# Patient Record
Sex: Male | Born: 1995 | Race: White | Hispanic: Yes | Smoking: Former smoker
Health system: Southern US, Community
[De-identification: ages and names within clinical notes are randomized; demographics above are authoritative.]

## PROBLEM LIST (undated history)

## (undated) DIAGNOSIS — E0590 Thyrotoxicosis, unspecified without thyrotoxic crisis or storm: Secondary | ICD-10-CM

## (undated) DIAGNOSIS — Z789 Other specified health status: Secondary | ICD-10-CM

## (undated) HISTORY — DX: Other specified health status: Z78.9

---
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
## 2019-06-07 ENCOUNTER — Emergency Department (HOSPITAL_COMMUNITY)
Admission: EM | Admit: 2019-06-07 | Discharge: 2019-06-08 | Disposition: A | Discharge status: Home / Self Care | Payer: Self-pay | Attending: Emergency Medicine | Admitting: Emergency Medicine

## 2019-06-07 ENCOUNTER — Emergency Department (HOSPITAL_COMMUNITY): Payer: Self-pay

## 2019-06-07 ENCOUNTER — Encounter (HOSPITAL_COMMUNITY): Payer: Self-pay

## 2019-06-07 ENCOUNTER — Other Ambulatory Visit: Payer: Self-pay

## 2019-06-07 DIAGNOSIS — R0602 Shortness of breath: Secondary | ICD-10-CM | POA: Insufficient documentation

## 2019-06-07 DIAGNOSIS — Z20828 Contact with and (suspected) exposure to other viral communicable diseases: Secondary | ICD-10-CM | POA: Insufficient documentation

## 2019-06-07 DIAGNOSIS — J069 Acute upper respiratory infection, unspecified: Secondary | ICD-10-CM | POA: Insufficient documentation

## 2019-06-07 MED ORDER — ACETAMINOPHEN 500 MG PO TABS
1000.0000 mg | ORAL_TABLET | Freq: Once | ORAL | Status: AC
  Administered 2019-06-07: 1000 mg via ORAL
  Filled 2019-06-07: qty 2

## 2019-06-07 NOTE — ED Triage Notes (Signed)
Pt c/o dry cough and sore throat x 4 days. Pt states he did have a temp of 99.0 earlier, however it has gone down since then. Pt denies any COVID contact. Pt denies headache or CP. Sates he has occasional SHOB.

## 2019-06-07 NOTE — ED Notes (Signed)
Provided pt with water for fluid encouragement.

## 2019-06-08 NOTE — ED Notes (Signed)
Pt was verbalized discharge instructions. Pt had no further questions at this time. NAD. 

## 2019-06-08 NOTE — ED Notes (Signed)
Pt able to tolerate small sips of water. Will continue to monitor.

## 2019-06-08 NOTE — Discharge Instructions (Addendum)
You were evaluated in the Emergency Department and after careful evaluation, we did not find any emergent condition requiring admission or further testing in the hospital.  Your exam/testing today is overall reassuring.  Your symptoms seem to be due to a viral illness, possibly the coronavirus.  We have tested you for the coronavirus here in the Emergency Department.  Please isolate or quarantine at home until you receive a negative test result.  If positive, we recommend continued home quarantine per CDC recommendations.   Please return to the Emergency Department if you experience any worsening of your condition.  We encourage you to follow up with a primary care provider.  Thank you for allowing us to be a part of your care. 

## 2019-06-08 NOTE — ED Provider Notes (Signed)
[facility]-EMERGENCY DEPT [facility] Emergency Department Provider Note MRN:  [id number]  Arrival date & time: 06/08/19     Chief Complaint   Cough and Sore Throat   History of Present Illness   Joseph Mclaughlin is a 23 y.o. year-old male with no pertinent past medical history presenting to the ED with chief complaint of cough and sore throat.  4 days of dry cough, mild sore throat.  Denies headache or vision change.  Endorsing mild shortness of breath today.  Denies chest pain, no abdominal pain, no vomiting, no diarrhea.  Review of Systems  A complete 10 system review of systems was obtained and all systems are negative except as noted in the HPI and PMH.   Patient's Health History    Past Medical History:  Diagnosis Date  . Medical history non-contributory     History reviewed. No pertinent surgical history.  No family history on file.  Social History   Socioeconomic History  . Marital status: Single    Spouse name: Not on file  . Number of children: Not on file  . Years of education: Not on file  . Highest education level: Not on file  Occupational History  . Not on file  Social Needs  . Financial resource strain: Not on file  . Food insecurity    Worry: Not on file    Inability: Not on file  . Transportation needs    Medical: Not on file    Non-medical: Not on file  Tobacco Use  . Smoking status: Never Smoker  . Smokeless tobacco: Never Used  Substance and Sexual Activity  . Alcohol use: No  . Drug use: No  . Sexual activity: Not on file  Lifestyle  . Physical activity    Days per week: Not on file    Minutes per session: Not on file  . Stress: Not on file  Relationships  . Social Musician on phone: Not on file    Gets together: Not on file    Attends religious service: Not on file    Active member of club or organization: Not on file    Attends meetings of clubs or organizations: Not on file    Relationship status: Not on file  .  Intimate partner violence    Fear of current or ex partner: Not on file    Emotionally abused: Not on file    Physically abused: Not on file    Forced sexual activity: Not on file  Other Topics Concern  . Not on file  Social History Narrative  . Not on file     Physical Exam  Vital Signs and Nursing Notes reviewed Vitals:   06/07/19 2303 06/08/19 0000  BP: (!) 161/90 (!) 127/91  Pulse: (!) 120 (!) 116  Resp: 20 20  Temp: 98.2 F (36.8 C)   SpO2: 96% 96%    CONSTITUTIONAL: Well-appearing, NAD NEURO:  Alert and oriented x 3, no focal deficits EYES:  eyes equal and reactive ENT/NECK:  no LAD, no JVD CARDIO: Tachycardic rate, well-perfused, normal S1 and S2 PULM:  CTAB no wheezing or rhonchi GI/GU:  normal bowel sounds, non-distended, non-tender MSK/SPINE:  No gross deformities, no edema SKIN:  no rash, atraumatic PSYCH:  Appropriate speech and behavior  Diagnostic and Interventional Summary    EKG Interpretation  Date/Time:    Ventricular Rate:    PR Interval:    QRS Duration:   QT Interval:    QTC Calculation:  R Axis:     Text Interpretation:        Labs Reviewed  NOVEL CORONAVIRUS, NAA (HOSP ORDER, SEND-OUT TO REF LAB; TAT 18-24 HRS)    XR Chest Single View  Final Result      Medications  acetaminophen (TYLENOL) tablet 1,000 mg (1,000 mg Oral Given 06/07/19 2352)     Procedures  /  Critical Care Procedures  ED Course and Medical Decision Making  I have reviewed the triage vital signs and the nursing notes.  Pertinent labs & imaging results that were available during my care of the patient were reviewed by me and considered in my medical decision making (see below for details).    Consistent with viral illness, possibly flu or coronavirus.  Patient complaining of some mild shortness of breath but chest x-ray is normal, no increased work of breathing, clear lungs.  No evidence of DVT, doubt pulmonary embolism.  Patient's heart rate improved after  Tylenol and p.o. fluids.  Appropriate for discharge.  Barth Kirks. Sedonia Small, Major [email or web address]  Final Clinical Impressions(s) / ED Diagnoses     ICD-10-CM   1. Viral URI with cough  J06.9   2. SOB (shortness of breath)  R06.02 XR Chest Single View    XR Chest Single View    ED Discharge Orders    None       Discharge Instructions Discussed with and Provided to Patient:     Discharge Instructions     You were evaluated in the Emergency Department and after careful evaluation, we did not find any emergent condition requiring admission or further testing in the hospital.  Your exam/testing today is overall reassuring.  Your symptoms seem to be due to a viral illness, possibly the coronavirus.  We have tested you for the coronavirus here in the Emergency Department.  Please isolate or quarantine at home until you receive a negative test result.  If positive, we recommend continued home quarantine per [facility] recommendations.   Please return to the Emergency Department if you experience any worsening of your condition.  We encourage you to follow up with a primary care provider.  Thank you for allowing [place] to be a part of your care.      Maudie Flakes, MD 06/08/19 [phone]

## 2019-06-09 LAB — NOVEL CORONAVIRUS, NAA (HOSP ORDER, SEND-OUT TO REF LAB; TAT 18-24 HRS): SARS-CoV-2, NAA: NOT DETECTED

## 2020-02-15 ENCOUNTER — Encounter (HOSPITAL_COMMUNITY): Payer: Self-pay | Admitting: Emergency Medicine

## 2020-02-15 ENCOUNTER — Other Ambulatory Visit: Payer: Self-pay

## 2020-02-15 ENCOUNTER — Emergency Department (HOSPITAL_COMMUNITY): Payer: Self-pay

## 2020-02-15 DIAGNOSIS — R05 Cough: Secondary | ICD-10-CM | POA: Insufficient documentation

## 2020-02-15 DIAGNOSIS — Z20822 Contact with and (suspected) exposure to covid-19: Secondary | ICD-10-CM | POA: Insufficient documentation

## 2020-02-15 NOTE — ED Triage Notes (Signed)
Patient arrives with complaints of a cough and fatigue. Patient has no other complaints at this time.

## 2020-02-16 ENCOUNTER — Emergency Department (HOSPITAL_COMMUNITY)
Admission: EM | Admit: 2020-02-16 | Discharge: 2020-02-16 | Disposition: A | Discharge status: Home / Self Care | Payer: Self-pay | Attending: Emergency Medicine | Admitting: Emergency Medicine

## 2020-02-16 DIAGNOSIS — Z20822 Contact with and (suspected) exposure to covid-19: Secondary | ICD-10-CM

## 2020-02-16 DIAGNOSIS — R05 Cough: Secondary | ICD-10-CM

## 2020-02-16 DIAGNOSIS — R059 Cough, unspecified: Secondary | ICD-10-CM

## 2020-02-16 LAB — SARS CORONAVIRUS 2 BY RT PCR (HOSPITAL ORDER, PERFORMED IN ~~LOC~~ HOSPITAL LAB): SARS Coronavirus 2: NEGATIVE

## 2020-02-16 NOTE — Discharge Instructions (Signed)
You have a Covid test pending and I will call you if results are positive.  I suspect your symptoms are due to Covid or other viral infection.  Please continue to quarantine at home and monitor your symptoms closely. You chest x-ray was clear. Antibiotics are not helpful in treating viral infection, the virus should run its course in about 10-14 days. Please make sure you are drinking plenty of fluids. You can treat your symptoms supportively with tylenol for fevers and pains, and over the counter cough syrups and throat lozenges to help with cough. If your symptoms are not improving please follow up with you Primary doctor.   I recommend that you purchase a home pulse ox to help better monitor your oxygen at home, if you start to have increased work of breathing or shortness of breath or your oxygen drops below 92% please immediately return to the hospital for reevaluation.  If you develop persistent fevers, shortness of breath or difficulty breathing, chest pain, severe headache and neck pain, persistent nausea and vomiting or other new or concerning symptoms return to the Emergency department.

## 2020-02-16 NOTE — ED Notes (Signed)
ED Provider at bedside. 

## 2020-02-16 NOTE — ED Provider Notes (Signed)
[facility]-EMERGENCY DEPT Provider Note   CSN: [id number] Arrival date & time: 02/15/20  2057     History Chief Complaint  Patient presents with  . Cough    Joseph Mclaughlin is a 23 y.o. male.  Joseph Mclaughlin is a 24 y.o. male who is otherwise healthy, presents to the emergency department for evaluation of cough and fatigue.  Cough nasal congestion has been present for about a week but worsening last night while the patient was at work.  He works at [employer].  He states that he only has chest pain with coughing, no shortness of breath.  He has not had any fevers or chills, has had some body aches and headache.  No vomiting, diarrhea or abdominal pain.  Patient did have his Covid vaccine a few months ago and does not have any known direct exposures.  He has not taken any medicine to treat his symptoms prior to arrival.  No other aggravating or alleviating factors.        Past Medical History:  Diagnosis Date  . Medical history non-contributory     Patient Active Problem List   Diagnosis Date Noted  . Vasovagal near syncope 11/22/2014    History reviewed. No pertinent surgical history.     No family history on file.  Social History   Tobacco Use  . Smoking status: Never Smoker  . Smokeless tobacco: Never Used  Substance Use Topics  . Alcohol use: No  . Drug use: No    Home Medications Prior to Admission medications   Medication Sig Start Date End Date Taking? Authorizing Provider  loratadine (CLARITIN REDITABS) 10 MG dissolvable tablet Take 1 tablet (10 mg total) by mouth daily. Take one tablet as needed for allergy symptoms Patient not taking: Reported on 06/07/2019 05/05/15   Tilman Neat, MD    Allergies    Patient has no known allergies.  Review of Systems   Review of Systems  Constitutional: Positive for fatigue. Negative for chills and fever.  HENT: Positive for congestion and rhinorrhea.   Eyes: Negative for visual  disturbance.  Respiratory: Positive for cough. Negative for shortness of breath.   Cardiovascular: Negative for chest pain.  Gastrointestinal: Negative for abdominal pain, diarrhea, nausea and vomiting.  Genitourinary: Negative for dysuria and frequency.  Musculoskeletal: Negative for arthralgias and myalgias.  Skin: Negative for color change and rash.  Neurological: Positive for headaches. Negative for dizziness, syncope and light-headedness.  All other systems reviewed and are negative.   Physical Exam Updated Vital Signs BP 140/88 (BP Location: Left Arm)   Pulse (!) 115   Temp 98.2 F (36.8 C) (Oral)   Resp 16   Ht 5\' 5"  (1.651 m)   Wt 57.1 kg   SpO2 96%   BMI 20.95 kg/m   Physical Exam Vitals and nursing note reviewed.  Constitutional:      General: He is not in acute distress.    Appearance: Normal appearance. He is well-developed and normal weight. He is not ill-appearing or diaphoretic.     Comments: Well-appearing and in no distress  HENT:     Head: Normocephalic and atraumatic.  Eyes:     General:        Right eye: No discharge.        Left eye: No discharge.  Neck:     Comments: No rigidity Cardiovascular:     Rate and Rhythm: Normal rate and regular rhythm.     Heart sounds: Normal  heart sounds. No murmur heard.  No friction rub. No gallop.      Comments: Tachycardic on arrival, but on my evaluation normal heart rate in the 90s Pulmonary:     Effort: Pulmonary effort is normal. No respiratory distress.     Breath sounds: Normal breath sounds.     Comments: Respirations equal and unlabored, patient able to speak in full sentences, lungs clear to auscultation bilaterally Abdominal:     General: Bowel sounds are normal. There is no distension.     Palpations: Abdomen is soft. There is no mass.     Tenderness: There is no abdominal tenderness. There is no guarding.     Comments: Abdomen soft, nondistended, nontender to palpation in all quadrants without  guarding or peritoneal signs  Musculoskeletal:        General: No deformity.     Cervical back: Neck supple.  Lymphadenopathy:     Cervical: No cervical adenopathy.  Skin:    General: Skin is warm and dry.     Capillary Refill: Capillary refill takes less than 2 seconds.  Neurological:     Mental Status: He is alert and oriented to person, place, and time.  Psychiatric:        Mood and Affect: Mood normal.        Behavior: Behavior normal.     ED Results / Procedures / Treatments   Labs (all labs ordered are listed, but only abnormal results are displayed) Labs Reviewed  SARS CORONAVIRUS 2 BY RT PCR (HOSPITAL ORDER, PERFORMED IN [facility])    EKG None  Radiology DG Chest 2 View  Result Date: 02/15/2020 CLINICAL DATA:  Cough EXAM: CHEST - 2 VIEW COMPARISON:  06/07/2019 FINDINGS: Heart and mediastinal contours are within normal limits. No focal opacities or effusions. No acute bony abnormality. IMPRESSION: No active cardiopulmonary disease. Electronically Signed   By: Charlett Nose M.D.   On: 02/15/2020 23:25    Procedures Procedures (including critical care time)  Medications Ordered in ED Medications - No data to display  ED Course  I have reviewed the triage vital signs and the nursing notes.  Pertinent labs & imaging results that were available during my care of the patient were reviewed by me and considered in my medical decision making (see chart for details).    MDM Rules/Calculators/A&P                          24 year old male presents with 1 week of cough and congestion, as well as some fatigue and headaches.  No fevers or chills.  No known exposures but does work in [employer].  Reports symptoms got worse last night so he presented for evaluation.  No associated chest pain or shortness of breath, no GI symptoms.  On arrival patient was tachycardic, but on my evaluation without any intervention tachycardia has resolved, vitals are normal and  patient is well-appearing.  Despite symptoms his chest x-ray is clear.  Concern for potential Covid infection versus other viral URI, will send Covid swab, discussed home quarantine and symptomatic treatment at home as well as return precautions.  Patient is otherwise healthy and does not meet criteria for monoclonal antibody infusions.  Will call patient if Covid test results are positive, stable for discharge home at this time.  Joseph Mclaughlin was evaluated in Emergency Department on 02/16/2020 for the symptoms described in the history of present illness. He was evaluated in the  context of the global COVID-19 pandemic, which necessitated consideration that the patient might be at risk for infection with the SARS-CoV-2 virus that causes COVID-19. Institutional protocols and algorithms that pertain to the evaluation of patients at risk for COVID-19 are in a state of rapid change based on information released by regulatory bodies including the CDC and federal and state organizations. These policies and algorithms were followed during the patient's care in the ED.   Final Clinical Impression(s) / ED Diagnoses Final diagnoses:  Cough  Suspected COVID-19 virus infection    Rx / DC Orders ED Discharge Orders    None       Legrand Rams 02/16/20 1308    Linwood Dibbles, MD 02/18/20 [phone]

## 2020-05-26 ENCOUNTER — Other Ambulatory Visit: Payer: Self-pay

## 2020-05-26 ENCOUNTER — Ambulatory Visit
Admission: EM | Admit: 2020-05-26 | Discharge: 2020-05-26 | Disposition: A | Discharge status: Home / Self Care | Payer: Self-pay | Attending: Emergency Medicine | Admitting: Emergency Medicine

## 2020-05-26 DIAGNOSIS — J029 Acute pharyngitis, unspecified: Secondary | ICD-10-CM | POA: Insufficient documentation

## 2020-05-26 DIAGNOSIS — R059 Cough, unspecified: Secondary | ICD-10-CM | POA: Insufficient documentation

## 2020-05-26 LAB — POCT RAPID STREP A (OFFICE): Rapid Strep A Screen: NEGATIVE

## 2020-05-26 MED ORDER — FAMOTIDINE 40 MG PO TABS: 40.0000 mg | ORAL_TABLET | Freq: Every day | ORAL | 0 refills | Status: DC

## 2020-05-26 NOTE — ED Provider Notes (Signed)
[facility]    CSN: [id number] Arrival date & time: 05/26/20  1612      History   Chief Complaint Chief Complaint  Patient presents with  . Cough  . Sore Throat    HPI Joseph Mclaughlin is a 24 y.o. male  History was provided by the patient. Joseph Mclaughlin is a 24 y.o. male who presents for evaluation of a sore throat. Associated symptoms include sore throat. Onset of symptoms was several weeks ago, unchanged since that time.  He is drinking plenty of fluids. He has not had recent close exposure to someone with proven streptococcal pharyngitis.  Endorsing dry cough w/o CP, SOB.  Has h/o GERD: not taking anything for this. The following portions of the patient's history were reviewed and updated as appropriate: allergies, current medications, past family history, past medical history, past social history, past surgical history and problem list.     Past Medical History:  Diagnosis Date  . Medical history non-contributory     Patient Active Problem List   Diagnosis Date Noted  . Vasovagal near syncope 11/22/2014    History reviewed. No pertinent surgical history.     Home Medications    Prior to Admission medications   Medication Sig Start Date End Date Taking? Authorizing Provider  famotidine (PEPCID) 40 MG tablet Take 1 tablet (40 mg total) by mouth daily. 05/26/20   Hall-Potvin, [place], PA-C  loratadine (CLARITIN REDITABS) 10 MG dissolvable tablet Take 1 tablet (10 mg total) by mouth daily. Take one tablet as needed for allergy symptoms Patient not taking: Reported on 06/07/2019 05/05/15 05/26/20  Tilman Neat, MD    Family History Family History  Problem Relation Age of Onset  . Healthy Mother   . Healthy Father     Social History Social History   Tobacco Use  . Smoking status: Never Smoker  . Smokeless tobacco: Never Used  Vaping Use  . Vaping Use: Every day  Substance Use Topics  . Alcohol use: Yes  . Drug use: Yes     Types: Marijuana     Allergies   Patient has no known allergies.   Review of Systems Review of Systems  Constitutional: Negative for fatigue and fever.  HENT: Positive for sore throat. Negative for congestion.   Respiratory: Positive for cough. Negative for shortness of breath and wheezing.   Cardiovascular: Negative for chest pain and palpitations.  Gastrointestinal: Negative for abdominal pain, diarrhea and vomiting.  Musculoskeletal: Negative for arthralgias and myalgias.  Skin: Negative for rash and wound.  Neurological: Negative for speech difficulty and headaches.  All other systems reviewed and are negative.    Physical Exam Triage Vital Signs ED Triage Vitals  Enc Vitals Group     BP 05/26/20 1644 132/83     Pulse Rate 05/26/20 1644 (!) 102     Resp 05/26/20 1644 18     Temp 05/26/20 1644 98.4 F (36.9 C)     Temp Source 05/26/20 1644 Oral     SpO2 05/26/20 1644 95 %     Weight --      Height --      Head Circumference --      Peak Flow --      Pain Score 05/26/20 1642 0     Pain Loc --      Pain Edu? --      Excl. in GC? --    No data found.  Updated Vital Signs BP 132/83 (BP  Location: Left Arm)   Pulse (!) 102   Temp 98.4 F (36.9 C) (Oral)   Resp 18   SpO2 95%   Visual Acuity Right Eye Distance:   Left Eye Distance:   Bilateral Distance:    Right Eye Near:   Left Eye Near:    Bilateral Near:     Physical Exam Constitutional:      General: He is not in acute distress.    Appearance: He is well-developed and normal weight. He is not ill-appearing, toxic-appearing or diaphoretic.  HENT:     Head: Normocephalic and atraumatic.     Right Ear: Tympanic membrane and ear canal normal.     Left Ear: Tympanic membrane and ear canal normal.     Mouth/Throat:     Mouth: Mucous membranes are moist.     Pharynx: Oropharynx is clear. Uvula midline. No posterior oropharyngeal erythema or uvula swelling.     Tonsils: No tonsillar exudate. 1+ on the  right. 1+ on the left.  Eyes:     General: No scleral icterus.    Conjunctiva/sclera: Conjunctivae normal.     Pupils: Pupils are equal, round, and reactive to light.  Neck:     Comments: Trachea midline, negative JVD Cardiovascular:     Rate and Rhythm: Normal rate and regular rhythm.  Pulmonary:     Effort: Pulmonary effort is normal. No respiratory distress.     Breath sounds: No wheezing.  Musculoskeletal:     Cervical back: Neck supple. No tenderness.  Lymphadenopathy:     Cervical: No cervical adenopathy.  Skin:    Capillary Refill: Capillary refill takes less than 2 seconds.     Coloration: Skin is not jaundiced or pale.     Findings: No rash.  Neurological:     Mental Status: He is alert and oriented to person, place, and time.      UC Treatments / Results  Labs (all labs ordered are listed, but only abnormal results are displayed) Labs Reviewed  CULTURE, GROUP A STREP [facility])  POCT RAPID STREP A (OFFICE)    EKG   Radiology No results found.  Procedures Procedures (including critical care time)  Medications Ordered in UC Medications - No data to display  Initial Impression / Assessment and Plan / UC Course  I have reviewed the triage vital signs and the nursing notes.  Pertinent labs & imaging results that were available during my care of the patient were reviewed by me and considered in my medical decision making (see chart for details).     Strep negative, culture pending.  likely 2/2 uncontrolled gerd given hx, ST, dry cough.  Will trial H2 & f/u w/ PCP and/or GI prn.  Return precautions discussed, pt verbalized understanding and is agreeable to plan. Final Clinical Impressions(s) / UC Diagnoses   Final diagnoses:  Sore throat  Cough     Discharge Instructions     Your rapid strep test was negative today.  The culture is pending.  Please look on your MyChart for test results.   We will notify you if the culture positive and outline a  treatment plan at that time.   Please continue Tylenol and/or Ibuprofen as needed for fever, pain.  May try warm salt water gargles, cepacol lozenges, throat spray, warm tea or water with lemon/honey, or OTC cold relief medicine for throat discomfort.   For congestion: take a daily anti-histamine like Zyrtec, Claritin, and a oral decongestant to help with post nasal  drip that may be irritating your throat.   It is important to stay hydrated: drink plenty of fluids (primarily water) to keep your throat moisturized and help further relieve irritation/discomfort.     ED Prescriptions    Medication Sig Dispense Auth. Provider   famotidine (PEPCID) 40 MG tablet Take 1 tablet (40 mg total) by mouth daily. 30 tablet Hall-Potvin, [place], PA-C     PDMP not reviewed this encounter.   Odette Fraction [place], [place] 05/26/20 1809

## 2020-05-26 NOTE — Discharge Instructions (Signed)

## 2020-05-26 NOTE — ED Triage Notes (Signed)
Pt is here with a cough and sore throat that started 5 weeks ago, pt has taken OTC meds to relieve discomfort. Pt states he does NOT have a PCP and has not been seen.

## 2020-05-30 LAB — CULTURE, GROUP A STREP (THRC)

## 2020-06-15 ENCOUNTER — Other Ambulatory Visit: Payer: Self-pay

## 2020-06-15 ENCOUNTER — Encounter (HOSPITAL_COMMUNITY): Payer: Self-pay | Admitting: Emergency Medicine

## 2020-06-15 ENCOUNTER — Emergency Department (HOSPITAL_COMMUNITY)
Admission: EM | Admit: 2020-06-15 | Discharge: 2020-06-15 | Disposition: A | Discharge status: Home / Self Care | Payer: Self-pay | Attending: Emergency Medicine | Admitting: Emergency Medicine

## 2020-06-15 ENCOUNTER — Encounter (HOSPITAL_COMMUNITY): Payer: Self-pay | Admitting: *Deleted

## 2020-06-15 DIAGNOSIS — K0889 Other specified disorders of teeth and supporting structures: Secondary | ICD-10-CM | POA: Insufficient documentation

## 2020-06-15 DIAGNOSIS — K029 Dental caries, unspecified: Secondary | ICD-10-CM | POA: Insufficient documentation

## 2020-06-15 MED ORDER — TRAMADOL HCL 50 MG PO TABS: 50.0000 mg | ORAL_TABLET | Freq: Four times a day (QID) | ORAL | 0 refills | Status: DC | PRN

## 2020-06-15 MED ORDER — TRAMADOL HCL 50 MG PO TABS
50.0000 mg | ORAL_TABLET | Freq: Once | ORAL | Status: AC
  Administered 2020-06-15: 50 mg via ORAL
  Filled 2020-06-15: qty 1

## 2020-06-15 MED ORDER — PENICILLIN V POTASSIUM 500 MG PO TABS
500.0000 mg | ORAL_TABLET | Freq: Four times a day (QID) | ORAL | 0 refills | Status: AC
Start: 2020-06-15 — End: 2020-06-25

## 2020-06-15 MED ORDER — NAPROXEN 500 MG PO TABS: 500.0000 mg | ORAL_TABLET | Freq: Two times a day (BID) | ORAL | 0 refills | Status: DC

## 2020-06-15 NOTE — ED Triage Notes (Signed)
The pt has had a toothache  For 3-4 days

## 2020-06-15 NOTE — ED Provider Notes (Signed)
[facility] EMERGENCY DEPARTMENT Provider Note   CSN: [id number] Arrival date & time: 06/15/20  1311     History Chief Complaint  Patient presents with   Dental Pain    Joseph Mclaughlin is a 24 y.o. male otherwise healthy here with dental pain. Patient states that he has dental pain for several days. Was seen here earlier and prescribed Pen VK but still has pain so returned for evaluation. Has some R lower gum swelling as well. Denies trouble swallowing or fever.    The history is provided by the patient.       Past Medical History:  Diagnosis Date   Medical history non-contributory     Patient Active Problem List   Diagnosis Date Noted   Vasovagal near syncope 11/22/2014    History reviewed. No pertinent surgical history.     Family History  Problem Relation Age of Onset   Healthy Mother    Healthy Father     Social History   Tobacco Use   Smoking status: Never Smoker   Smokeless tobacco: Never Used  Building services engineer Use: Every day  Substance Use Topics   Alcohol use: Yes   Drug use: Yes    Types: Marijuana    Home Medications Prior to Admission medications   Medication Sig Start Date End Date Taking? Authorizing Provider  famotidine (PEPCID) 40 MG tablet Take 1 tablet (40 mg total) by mouth daily. 05/26/20   Hall-Potvin, [place], PA-C  penicillin v potassium (VEETID) 500 MG tablet Take 1 tablet (500 mg total) by mouth 4 (four) times daily for 10 days. 06/15/20 06/25/20  Gailen Shelter, PA  loratadine (CLARITIN REDITABS) 10 MG dissolvable tablet Take 1 tablet (10 mg total) by mouth daily. Take one tablet as needed for allergy symptoms Patient not taking: Reported on 06/07/2019 05/05/15 05/26/20  Tilman Neat, MD    Allergies    Patient has no known allergies.  Review of Systems   Review of Systems  HENT: Positive for dental problem.   All other systems reviewed and are negative.   Physical Exam Updated  Vital Signs BP (!) 145/87    Pulse (!) 112    Temp 98.2 F (36.8 C) (Oral)    Resp 17    SpO2 97%   Physical Exam Vitals and nursing note reviewed.  Constitutional:      Appearance: Normal appearance.  HENT:     Head: Normocephalic.     Nose: Nose normal.     Mouth/Throat:     Comments: R lower canine with large cavity, no obvious periapical abscess, no obvious tongue swelling  Eyes:     Extraocular Movements: Extraocular movements intact.     Pupils: Pupils are equal, round, and reactive to light.  Cardiovascular:     Rate and Rhythm: Normal rate and regular rhythm.     Pulses: Normal pulses.     Heart sounds: Normal heart sounds.  Pulmonary:     Effort: Pulmonary effort is normal.     Breath sounds: Normal breath sounds.  Abdominal:     General: Abdomen is flat.     Palpations: Abdomen is soft.  Musculoskeletal:        General: Normal range of motion.     Cervical back: Normal range of motion and neck supple.  Skin:    General: Skin is warm.     Capillary Refill: Capillary refill takes less than 2 seconds.  Neurological:  General: No focal deficit present.     Mental Status: He is alert.  Psychiatric:        Mood and Affect: Mood normal.        Behavior: Behavior normal.     ED Results / Procedures / Treatments   Labs (all labs ordered are listed, but only abnormal results are displayed) Labs Reviewed - No data to display  EKG None  Radiology No results found.  Procedures Procedures (including critical care time)  Medications Ordered in ED Medications  traMADol (ULTRAM) tablet 50 mg (has no administration in time range)    ED Course  I have reviewed the triage vital signs and the nursing notes.  Pertinent labs & imaging results that were available during my care of the patient were reviewed by me and considered in my medical decision making (see chart for details).    MDM Rules/Calculators/A&P                         Joseph Mclaughlin is a 24  y.o. male here with dental pain. Patient was prescribed pen VK earlier today. He appears to have large cavity that is painful. Will give tramadol and told him to take his abx and call dentist tomorrow    Final Clinical Impression(s) / ED Diagnoses Final diagnoses:  None    Rx / DC Orders ED Discharge Orders    None       Charlynne Pander, MD 06/15/20 1600

## 2020-06-15 NOTE — Discharge Instructions (Signed)
Take naprosyn for pain   Take tramadol for severe pain   Take penicillin as prescribed earlier   Call dentist tomorrow for follow up   Return to ER if you have worse pain, trouble swallowing, fever.

## 2020-06-15 NOTE — Discharge Instructions (Addendum)
You have severe dental decay.  This will need to be seen by a dentist expediently.  I have provided information for one of our dentist associated with [facility].  Please call tomorrow morning to do schedule an appointment.  Please take the antibiotic as prescribed you as prescribed.  Please drink plenty of water monitor your symptoms and use Tylenol and ibuprofen as described below.  Please use Tylenol or ibuprofen for pain.  You may use 600 mg ibuprofen every 6 hours or 1000 mg of Tylenol every 6 hours.  You may choose to alternate between the 2.  This would be most effective.  Not to exceed 4 g of Tylenol within 24 hours.  Not to exceed 3200 mg ibuprofen 24 hours.

## 2020-06-15 NOTE — ED Provider Notes (Signed)
[facility] EMERGENCY DEPARTMENT Provider Note   CSN: [id number] Arrival date & time: 06/15/20  4259     History Chief Complaint  Patient presents with  . Dental Pain    Joseph Mclaughlin is a 24 y.o. male.  HPI Patient is a 24 year old male presented today with dental pain.  He states that his teeth have been hurting for several months but have been more painful over the past 3 to 4 days.  He states his right lower teeth are the ones that are hurting him although he has diffuse dental decay.  He has been using Orajel for quite some time he states many months.  He states that this become less effective for analgesia recently.  He has not seen a dentist over the past few years.  He denies any fevers or difficulty chewing or swallowing.  He is able to open his mouth and close his mouth without difficulty  No other symptoms.  He has taken no Tylenol or ibuprofen for pain.    Past Medical History:  Diagnosis Date  . Medical history non-contributory     Patient Active Problem List   Diagnosis Date Noted  . Vasovagal near syncope 11/22/2014    History reviewed. No pertinent surgical history.     Family History  Problem Relation Age of Onset  . Healthy Mother   . Healthy Father     Social History   Tobacco Use  . Smoking status: Never Smoker  . Smokeless tobacco: Never Used  Vaping Use  . Vaping Use: Every day  Substance Use Topics  . Alcohol use: Yes  . Drug use: Yes    Types: Marijuana    Home Medications Prior to Admission medications   Medication Sig Start Date End Date Taking? Authorizing Provider  famotidine (PEPCID) 40 MG tablet Take 1 tablet (40 mg total) by mouth daily. 05/26/20   Hall-Potvin, [place], PA-C  penicillin v potassium (VEETID) 500 MG tablet Take 1 tablet (500 mg total) by mouth 4 (four) times daily for 10 days. 06/15/20 06/25/20  Gailen Shelter, PA  loratadine (CLARITIN REDITABS) 10 MG dissolvable tablet Take 1  tablet (10 mg total) by mouth daily. Take one tablet as needed for allergy symptoms Patient not taking: Reported on 06/07/2019 05/05/15 05/26/20  Tilman Neat, MD    Allergies    Patient has no known allergies.  Review of Systems   Review of Systems  Constitutional: Negative for chills and fever.  HENT: Positive for dental problem. Negative for congestion.   Respiratory: Negative for shortness of breath.   Cardiovascular: Negative for chest pain.  Gastrointestinal: Negative for abdominal pain.  Musculoskeletal: Negative for neck pain.    Physical Exam Updated Vital Signs BP 140/86   Pulse 91   Temp 98.2 F (36.8 C) (Oral)   Resp 16   Ht 5\' 5"  (1.651 m)   Wt 57.1 kg   SpO2 97%   BMI 20.95 kg/m   Physical Exam Vitals and nursing note reviewed.  Constitutional:      General: He is not in acute distress.    Appearance: Normal appearance. He is not ill-appearing.  HENT:     Head: Normocephalic and atraumatic.     Mouth/Throat:      Comments: Diffuse caries.  Receding gums present. Eyes:     General: No scleral icterus.       Right eye: No discharge.        Left eye: No discharge.  Conjunctiva/sclera: Conjunctivae normal.  Pulmonary:     Effort: Pulmonary effort is normal.     Breath sounds: No stridor.  Neurological:     Mental Status: He is alert and oriented to person, place, and time. Mental status is at baseline.     ED Results / Procedures / Treatments   Labs (all labs ordered are listed, but only abnormal results are displayed) Labs Reviewed - No data to display  EKG None  Radiology No results found.  Procedures Procedures (including critical care time)  Medications Ordered in ED Medications - No data to display  ED Course  I have reviewed the triage vital signs and the nursing notes.  Pertinent labs & imaging results that were available during my care of the patient were reviewed by me and considered in my medical decision making (see  chart for details).    MDM Rules/Calculators/A&P                          Patient has diffuse severe cavities.  He has fully eroded molars in the area that he is having pain.  He has no trismus he has full range of motion of tongue no sublingual tenderness to palpation.  He is very well-appearing.  He has no systemic symptoms.  He does have some swelling of the gumline and some receding of the gumline as well.  We will provide patient with penicillin VK and have follow-up with our consultant dentist Dr. Mayford Knife.  Patient understands return precautions and importance of close follow-up with dentist.  Final Clinical Impression(s) / ED Diagnoses Final diagnoses:  Pain, dental  Dental caries    Rx / DC Orders ED Discharge Orders         Ordered    penicillin v potassium (VEETID) 500 MG tablet  4 times daily        06/15/20 0946           Gailen Shelter, PA 06/15/20 0175    Maia Plan, MD 06/18/20 1235

## 2020-06-15 NOTE — ED Triage Notes (Signed)
C/o R lower dental pain for 4 days.

## 2020-10-02 ENCOUNTER — Other Ambulatory Visit: Payer: Self-pay

## 2020-10-02 ENCOUNTER — Ambulatory Visit
Admission: EM | Admit: 2020-10-02 | Discharge: 2020-10-02 | Disposition: A | Discharge status: Home / Self Care | Payer: Self-pay | Attending: Family Medicine | Admitting: Family Medicine

## 2020-10-02 DIAGNOSIS — J0110 Acute frontal sinusitis, unspecified: Secondary | ICD-10-CM

## 2020-10-02 MED ORDER — AMOXICILLIN 875 MG PO TABS: 875.0000 mg | ORAL_TABLET | Freq: Two times a day (BID) | ORAL | 0 refills | Status: AC

## 2020-10-02 NOTE — ED Triage Notes (Signed)
Pt c/o sinus headache and pressure x2wks, states worse when bending his head down.

## 2020-10-02 NOTE — ED Provider Notes (Signed)
  [facility] CARE CENTER   [id number] 10/02/20 Arrival Time: 1436  ASSESSMENT & PLAN:  1. Acute non-recurrent frontal sinusitis    Begin: Meds ordered this encounter  Medications  . amoxicillin (AMOXIL) 875 MG tablet    Sig: Take 1 tablet (875 mg total) by mouth 2 (two) times daily for 10 days.    Dispense:  20 tablet    Refill:  0   OTC symptom care as needed.   Follow-up Information    [facility] .   Specialty: Urgent Care Why: If worsening or failing to improve as anticipated. Contact information: [address] [id number] mc [place] [address] [phone]              Reviewed expectations re: course of current medical issues. Questions answered. Outlined signs and symptoms indicating need for more acute intervention. Patient verbalized understanding. After Visit Summary given.   SUBJECTIVE: History from: patient.  Joseph Mclaughlin is a 25 y.o. male who presents with complaint of nasal congestion, post-nasal drainage, and frontal sinus pain. Onset gradual, 2 w ago. Respiratory symptoms: none. Fever: absent. Overall normal PO intake without n/v. OTC treatment: none reported. Seasonal allergies: quesitons. History of frequent sinus infections: no. No specific aggravating or alleviating factors reported. Social History   Tobacco Use  Smoking Status Never Smoker  Smokeless Tobacco Never Used     OBJECTIVE:  Vitals:   10/02/20 1446  BP: 125/68  Pulse: (!) 104  Resp: 18  Temp: 98.1 F (36.7 C)  TempSrc: Oral  SpO2: 96%    Slight tachycardia noted; reg General appearance: alert; no distress HEENT: nasal congestion; clear runny nose; throat irritation secondary to post-nasal drainage; frontal sinus tenderness to palpation; turbinates boggy Neck: supple without LAD; trachea midline Lungs: unlabored respirations, symmetrical air entry; cough: absent; no respiratory distress Skin: warm and  dry Psychological: alert and cooperative; normal mood and affect  No Known Allergies  Past Medical History:  Diagnosis Date  . Medical history non-contributory    Family History  Problem Relation Age of Onset  . Healthy Mother   . Healthy Father    Social History   Socioeconomic History  . Marital status: Single    Spouse name: Not on file  . Number of children: Not on file  . Years of education: Not on file  . Highest education level: Not on file  Occupational History  . Not on file  Tobacco Use  . Smoking status: Never Smoker  . Smokeless tobacco: Never Used  Vaping Use  . Vaping Use: Every day  Substance and Sexual Activity  . Alcohol use: Yes  . Drug use: Yes    Types: Marijuana  . Sexual activity: Yes    Birth control/protection: None  Other Topics Concern  . Not on file  Social History Narrative  . Not on file   Social Determinants of Health   Financial Resource Strain: Not on file  Food Insecurity: Not on file  Transportation Needs: Not on file  Physical Activity: Not on file  Stress: Not on file  Social Connections: Not on file  Intimate Partner Violence: Not on file            Mardella Layman, MD 10/02/20 1455

## 2021-08-10 ENCOUNTER — Emergency Department (HOSPITAL_COMMUNITY)
Admission: EM | Admit: 2021-08-10 | Discharge: 2021-08-10 | Disposition: A | Discharge status: Home / Self Care | Payer: Self-pay | Attending: Emergency Medicine | Admitting: Emergency Medicine

## 2021-08-10 ENCOUNTER — Other Ambulatory Visit: Payer: Self-pay

## 2021-08-10 ENCOUNTER — Emergency Department (HOSPITAL_COMMUNITY): Payer: Self-pay

## 2021-08-10 ENCOUNTER — Encounter (HOSPITAL_COMMUNITY): Payer: Self-pay

## 2021-08-10 DIAGNOSIS — R7989 Other specified abnormal findings of blood chemistry: Secondary | ICD-10-CM | POA: Insufficient documentation

## 2021-08-10 DIAGNOSIS — E0590 Thyrotoxicosis, unspecified without thyrotoxic crisis or storm: Secondary | ICD-10-CM | POA: Insufficient documentation

## 2021-08-10 DIAGNOSIS — R0789 Other chest pain: Secondary | ICD-10-CM | POA: Insufficient documentation

## 2021-08-10 DIAGNOSIS — R079 Chest pain, unspecified: Secondary | ICD-10-CM

## 2021-08-10 LAB — CBC
HCT: 43.9 % (ref 39.0–52.0)
Hemoglobin: 15.2 g/dL (ref 13.0–17.0)
MCH: 29.7 pg (ref 26.0–34.0)
MCHC: 34.6 g/dL (ref 30.0–36.0)
MCV: 85.7 fL (ref 80.0–100.0)
Platelets: 186 10*3/uL (ref 150–400)
RBC: 5.12 MIL/uL (ref 4.22–5.81)
RDW: 11.9 % (ref 11.5–15.5)
WBC: 7.1 10*3/uL (ref 4.0–10.5)
nRBC: 0 % (ref 0.0–0.2)

## 2021-08-10 LAB — BASIC METABOLIC PANEL
Anion gap: 7 (ref 5–15)
BUN: 17 mg/dL (ref 6–20)
CO2: 29 mmol/L (ref 22–32)
Calcium: 8.9 mg/dL (ref 8.9–10.3)
Chloride: 100 mmol/L (ref 98–111)
Creatinine, Ser: 0.76 mg/dL (ref 0.61–1.24)
GFR, Estimated: >60 mL/min (ref >60)
Glucose, Bld: 102 mg/dL — ABNORMAL HIGH (ref 70–99)
Potassium: 4.1 mmol/L (ref 3.5–5.1)
Sodium: 136 mmol/L (ref 135–145)

## 2021-08-10 LAB — TSH: TSH: 0.101 u[IU]/mL — ABNORMAL LOW (ref 0.350–4.500)

## 2021-08-10 LAB — T4, FREE: Free T4: 1.05 ng/dL (ref 0.61–1.12)

## 2021-08-10 LAB — TROPONIN I (HIGH SENSITIVITY): Troponin I (High Sensitivity): <2 ng/L (ref <18)

## 2021-08-10 NOTE — ED Triage Notes (Signed)
Patient reports that he has had intermittent chest pain with SOB at times x a few weeks. Patient states when it occurs he "feels heat in his chest and heart." Last occurrence was today at work, but states he is not having chest pain at this time.

## 2021-08-10 NOTE — Discharge Instructions (Addendum)
Your TSH is 0.101 today. Call your doctor tomorrow for medication check and recheck following your visit today.

## 2021-08-10 NOTE — ED Provider Triage Note (Signed)
Emergency Medicine Provider Triage Evaluation Note  Joseph Mclaughlin , a 26 y.o. male  was evaluated in triage.  Pt complains of chest pain and shortness of breath.  Started today, is gone at this time.  Patient takes medicine for the hypothyroid, recently medicine was changed..  Review of Systems  Positive: CP, SOB Negative: SYNCOPE  Physical Exam  BP 136/90 (BP Location: Left Arm)    Pulse 83    Temp 98.4 F (36.9 C) (Oral)    Resp 18    Ht 5\' 5"  (1.651 m)    Wt 72.6 kg    SpO2 99%    BMI 26.63 kg/m  Gen:   Awake, no distress   Resp:  Normal effort  MSK:   Moves extremities without difficulty  Other:    Medical Decision Making  Medically screening exam initiated at 6:55 PM.  Appropriate orders placed.  Joseph Mclaughlin was informed that the remainder of the evaluation will be completed by another provider, this initial triage assessment does not replace that evaluation, and the importance of remaining in the ED until their evaluation is complete.     Sherrill Raring, PA-C 08/10/21 1859

## 2021-08-10 NOTE — ED Provider Notes (Signed)
[facility]-EMERGENCY DEPT Provider Note   CSN: [id number] Arrival date & time: 08/10/21  1834     History  Chief Complaint  Patient presents with   Chest Pain   Shortness of Breath    Joseph Mclaughlin is a 26 y.o. male.  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  Reports having a burning heaviness in the center of his chest which lasted for about an hour although still has some discomfort at this time.  He denies associated nausea, vomiting or diaphoresis.  Pain does not radiate, nothing makes pain better or worse.  Reports history of similar pain previously but has not been evaluated.  States he is a former smoker and quit "a long time ago" denies history of hypertension, diabetes, hyperlipidemia.  No significant family cardiac history.  Denies abdominal pain.  No other complaints or concerns.      Home Medications Prior to Admission medications   Medication Sig Start Date End Date Taking? Authorizing Provider  loratadine (CLARITIN REDITABS) 10 MG dissolvable tablet Take 1 tablet (10 mg total) by mouth daily. Take one tablet as needed for allergy symptoms Patient not taking: Reported on 06/07/2019 05/05/15 05/26/20  Tilman Neat, MD      Allergies    Patient has no known allergies.    Review of Systems   Review of Systems Negative except as per HPI Physical Exam Updated Vital Signs BP 136/90 (BP Location: Left Arm)    Pulse 83    Temp 98.4 F (36.9 C) (Oral)    Resp 18    Ht 5\' 5"  (1.651 m)    Wt 72.6 kg    SpO2 99%    BMI 26.63 kg/m  Physical Exam Vitals and nursing note reviewed.  Constitutional:      General: He is not in acute distress.    Appearance: He is well-developed. He is not diaphoretic.  HENT:     Head: Normocephalic and atraumatic.  Cardiovascular:     Rate and Rhythm: Normal rate and regular rhythm.     Heart sounds: Normal  heart sounds. No murmur heard. Pulmonary:     Effort: Pulmonary effort is normal.     Breath sounds: Normal breath sounds.  Chest:     Chest wall: No tenderness.  Abdominal:     Palpations: Abdomen is soft.     Tenderness: There is no abdominal tenderness.  Musculoskeletal:     Cervical back: Neck supple.  Skin:    General: Skin is warm and dry.     Findings: No erythema or rash.  Neurological:     Mental Status: He is alert and oriented to person, place, and time.  Psychiatric:        Behavior: Behavior normal.    ED Results / Procedures / Treatments   Labs (all labs ordered are listed, but only abnormal results are displayed) Labs Reviewed  BASIC METABOLIC PANEL - Abnormal; Notable for the following components:      Result Value   Glucose, Bld 102 (*)    All other components within normal limits  TSH - Abnormal; Notable for the following components:   TSH 0.101 (*)    All other components within normal limits  CBC  T4, FREE  TROPONIN I (HIGH SENSITIVITY)    EKG None  Radiology DG Chest 2 View  Result Date: 08/10/2021 CLINICAL  DATA:  Chest pain EXAM: CHEST - 2 VIEW COMPARISON:  Chest radiograph dated February 15, 2020 FINDINGS: The heart size and mediastinal contours are within normal limits. Both lungs are clear. The visualized skeletal structures are unremarkable. IMPRESSION: No active cardiopulmonary disease. Electronically Signed   By: Larose Hires D.O.   On: 08/10/2021 19:36    Procedures Procedures    Medications Ordered in ED Medications - No data to display  ED Course/ Medical Decision Making/ A&P                           Medical Decision Making Amount and/or Complexity of Data Reviewed Labs: ordered. Radiology: ordered.   26 year old male with history of hypothyroid with recent med adjustment presents with complaint of chest pain which occurred while he was at work today as above.  On exam, he is well-appearing, in no distress.  Lungs clear to  auscultation, heart regular rate and rhythm.  Vitals reviewed, he is not tachycardic.  He is not febrile.  Chest x-ray is unremarkable. Troponin is less than 2.  EKG without ischemic changes.  Labs otherwise unremarkable including CBC and BMP. TSH is low at 0.101 though improved compared to prior on file with his endocrinologist at 0.009. Patient to follow-up with his doctor regarding his lab work today, recheck for his chest pain, return to ED for worsening or concerning symptoms.        Final Clinical Impression(s) / ED Diagnoses Final diagnoses:  Chest pain, unspecified type  Hyperthyroidism    Rx / DC Orders ED Discharge Orders     None         Jeannie Fend, PA-C 08/10/21 2126    Tanda Rockers A, DO 08/13/21 1144

## 2022-01-16 ENCOUNTER — Emergency Department (HOSPITAL_COMMUNITY): Payer: Self-pay

## 2022-01-16 ENCOUNTER — Encounter (HOSPITAL_COMMUNITY): Payer: Self-pay

## 2022-01-16 ENCOUNTER — Other Ambulatory Visit: Payer: Self-pay

## 2022-01-16 ENCOUNTER — Emergency Department (HOSPITAL_COMMUNITY)
Admission: EM | Admit: 2022-01-16 | Discharge: 2022-01-17 | Disposition: A | Discharge status: Home / Self Care | Payer: Self-pay | Attending: Emergency Medicine | Admitting: Emergency Medicine

## 2022-01-16 DIAGNOSIS — R55 Syncope and collapse: Secondary | ICD-10-CM | POA: Insufficient documentation

## 2022-01-16 LAB — BASIC METABOLIC PANEL
Anion gap: 6 (ref 5–15)
BUN: 12 mg/dL (ref 6–20)
CO2: 24 mmol/L (ref 22–32)
Calcium: 8.4 mg/dL — ABNORMAL LOW (ref 8.9–10.3)
Chloride: 109 mmol/L (ref 98–111)
Creatinine, Ser: 0.8 mg/dL (ref 0.61–1.24)
GFR, Estimated: >60 mL/min (ref >60)
Glucose, Bld: 102 mg/dL — ABNORMAL HIGH (ref 70–99)
Potassium: 3.3 mmol/L — ABNORMAL LOW (ref 3.5–5.1)
Sodium: 139 mmol/L (ref 135–145)

## 2022-01-16 LAB — CBC WITH DIFFERENTIAL/PLATELET
Abs Immature Granulocytes: 0.01 10*3/uL (ref 0.00–0.07)
Basophils Absolute: 0 10*3/uL (ref 0.0–0.1)
Basophils Relative: 0 %
Eosinophils Absolute: 0.2 10*3/uL (ref 0.0–0.5)
Eosinophils Relative: 3 %
HCT: 39.8 % (ref 39.0–52.0)
Hemoglobin: 14.4 g/dL (ref 13.0–17.0)
Immature Granulocytes: 0 %
Lymphocytes Relative: 17 %
Lymphs Abs: 0.9 10*3/uL (ref 0.7–4.0)
MCH: 31 pg (ref 26.0–34.0)
MCHC: 36.2 g/dL — ABNORMAL HIGH (ref 30.0–36.0)
MCV: 85.8 fL (ref 80.0–100.0)
Monocytes Absolute: 0.4 10*3/uL (ref 0.1–1.0)
Monocytes Relative: 8 %
Neutro Abs: 3.7 10*3/uL (ref 1.7–7.7)
Neutrophils Relative %: 72 %
Platelets: 175 10*3/uL (ref 150–400)
RBC: 4.64 MIL/uL (ref 4.22–5.81)
RDW: 12 % (ref 11.5–15.5)
WBC: 5.3 10*3/uL (ref 4.0–10.5)
nRBC: 0 % (ref 0.0–0.2)

## 2022-01-16 LAB — URINALYSIS, ROUTINE W REFLEX MICROSCOPIC
Bilirubin Urine: NEGATIVE
Glucose, UA: NEGATIVE mg/dL
Ketones, ur: 5 mg/dL — AB
Leukocytes,Ua: NEGATIVE
Nitrite: NEGATIVE
Protein, ur: NEGATIVE mg/dL
Specific Gravity, Urine: 1.008 (ref 1.005–1.030)
pH: 7 (ref 5.0–8.0)

## 2022-01-16 LAB — TROPONIN I (HIGH SENSITIVITY): Troponin I (High Sensitivity): <2 ng/L (ref <18)

## 2022-01-16 LAB — ETHANOL: Alcohol, Ethyl (B): <10 mg/dL (ref <10)

## 2022-01-16 MED ORDER — SODIUM CHLORIDE 0.9 % IV BOLUS
1000.0000 mL | Freq: Once | INTRAVENOUS | Status: AC
  Administered 2022-01-16: 1000 mL via INTRAVENOUS

## 2022-01-16 MED ORDER — ACETAMINOPHEN 500 MG PO TABS
1000.0000 mg | ORAL_TABLET | Freq: Once | ORAL | Status: AC
  Administered 2022-01-16: 1000 mg via ORAL
  Filled 2022-01-16: qty 2

## 2022-01-16 MED ORDER — SODIUM CHLORIDE 0.9 % IV BOLUS
500.0000 mL | Freq: Once | INTRAVENOUS | Status: AC
  Administered 2022-01-16: 500 mL via INTRAVENOUS

## 2022-01-16 NOTE — ED Provider Notes (Signed)
[facility]-EMERGENCY DEPT Provider Note   CSN: [id number] Arrival date & time: 01/16/22  2136     History {Add pertinent medical, surgical, social history, OB history to HPI:1} Chief Complaint  Patient presents with   Loss of Consciousness    Joseph Mclaughlin is a 26 y.o. male.  26 year old male with prior medical history as detailed below presents via EMS for evaluation.  Patient apparently was working at [employer] in [employer].  Patient had witnessed syncopal event.  Coworkers felt that the patient had stopped breathing so they started CPR.  CPR was discontinued upon arrival of first responders who found that the patient had a pulse and was breathing.  EMS reports that upon their arrival the patient was alert and complained of feeling "weak all over."  He also complained of mild headache.  No reported seizure-like activity occurred.  Patient with apparent prior history of previous syncope.  Patient reports that he cannot recall the events leading to his collapse while at work.  He denies recent alcohol or drug use.  He reports distant history of drug use including marijuana and cocaine.  He denies chest pain or shortness of breath.  The history is provided by the patient and medical records.  Loss of Consciousness Episode history:  Single Most recent episode:  Today Duration:  5 minutes Timing:  Rare Progression:  Resolved Chronicity:  New      Home Medications Prior to Admission medications   Medication Sig Start Date End Date Taking? Authorizing Provider  loratadine (CLARITIN REDITABS) 10 MG dissolvable tablet Take 1 tablet (10 mg total) by mouth daily. Take one tablet as needed for allergy symptoms Patient not taking: Reported on 06/07/2019 05/05/15 05/26/20  Tilman Neat, MD      Allergies    Patient has no known allergies.    Review of Systems   Review of Systems  Cardiovascular:  Positive for syncope.  All other systems  reviewed and are negative.   Physical Exam Updated Vital Signs BP (!) 155/93 (BP Location: Right Arm)   Pulse 91   Temp 98 F (36.7 C)   Resp 12   SpO2 98%  Physical Exam Vitals and nursing note reviewed.  Constitutional:      General: He is not in acute distress.    Appearance: Normal appearance. He is well-developed.  HENT:     Head: Normocephalic and atraumatic.  Eyes:     Conjunctiva/sclera: Conjunctivae normal.     Pupils: Pupils are equal, round, and reactive to light.  Cardiovascular:     Rate and Rhythm: Normal rate and regular rhythm.     Heart sounds: Normal heart sounds.  Pulmonary:     Effort: Pulmonary effort is normal. No respiratory distress.     Breath sounds: Normal breath sounds.  Abdominal:     General: There is no distension.     Palpations: Abdomen is soft.     Tenderness: There is no abdominal tenderness.  Musculoskeletal:        General: No deformity. Normal range of motion.     Cervical back: Normal range of motion and neck supple.  Skin:    General: Skin is warm and dry.  Neurological:     General: No focal deficit present.     Mental Status: He is alert and oriented to person, place, and time. Mental status is at baseline.     Cranial Nerves: No cranial nerve deficit.     Sensory: No  sensory deficit.     Motor: No weakness.     Coordination: Coordination normal.     ED Results / Procedures / Treatments   Labs (all labs ordered are listed, but only abnormal results are displayed) Labs Reviewed  CBC WITH DIFFERENTIAL/PLATELET - Abnormal; Notable for the following components:      Result Value   MCHC 36.2 (*)    All other components within normal limits  BASIC METABOLIC PANEL - Abnormal; Notable for the following components:   Potassium 3.3 (*)    Glucose, Bld 102 (*)    Calcium 8.4 (*)    All other components within normal limits  ETHANOL  RAPID URINE DRUG SCREEN, HOSP PERFORMED  URINALYSIS, ROUTINE W REFLEX MICROSCOPIC  TROPONIN I  (HIGH SENSITIVITY)    EKG EKG Interpretation  Date/Time:  Saturday January 16 2022 21:43:16 EDT Ventricular Rate:  84 PR Interval:  190 QRS Duration: 93 QT Interval:  334 QTC Calculation: 395 R Axis:   115 Text Interpretation: Sinus rhythm Right axis deviation Confirmed by Kristine Royal [phone]) on 01/16/2022 10:33:10 PM  Radiology No results found.  Procedures Procedures  {Document cardiac monitor, telemetry assessment procedure when appropriate:1}  Medications Ordered in ED Medications  sodium chloride 0.9 % bolus 1,000 mL (has no administration in time range)  sodium chloride 0.9 % bolus 500 mL (has no administration in time range)  acetaminophen (TYLENOL) tablet 1,000 mg (has no administration in time range)    ED Course/ Medical Decision Making/ A&P                           Medical Decision Making Amount and/or Complexity of Data Reviewed Labs: ordered. Radiology: ordered.  Risk OTC drugs.    Medical Screen Complete  This patient presented to the ED with complaint of syncope.  This complaint involves an extensive number of treatment options. The initial differential diagnosis includes, but is not limited to, metabolic abnormality, seizure activity, dehydration, AKI, etc.  This presentation is: Acute, Self-Limited, Previously Undiagnosed, Uncertain Prognosis, Complicated, Systemic Symptoms, and Threat to Life/Bodily Function   Patient presenting after reported syncope that occurred while he was at work at [employer].  Patient cannot recall specific events leading to the incident.  He complains of generalized weakness upon arrival here in the ED.  He also complains of mild headache.  Bystanders apparently administered CPR prior to first responders and EMS arrival.  Patient was not pulseless and/or apneic per EMS.  No seizure-like activity was reported by bystanders.  Patient with history of hypothyroidism.  Patient reports that he takes medication for  same.  Dr. Pilar Plate aware of pending labs and need for re-evaluation.        Additional history obtained:  Additional history obtained from EMS External records from outside sources obtained and reviewed including prior ED visits and prior Inpatient records.    Lab Tests:  I ordered and personally interpreted labs.  The pertinent results include: CBC, BMP, EtOH, urine tox   Imaging Studies ordered:  I ordered imaging studies including CT head, CT C-spine, chest x-ray I independently visualized and interpreted obtained imaging which showed the ED I agree with the radiologist interpretation.   Cardiac Monitoring:  The patient was maintained on a cardiac monitor.  I personally viewed and interpreted the cardiac monitor which showed an underlying rhythm of: NSR   Medicines ordered:  I ordered medication including IV fluid for suspected dehydration Reevaluation of the patient  after these medicines showed that the patient: improved   Problem List / ED Course:  Syncope   Reevaluation:  After the interventions noted above, I reevaluated the patient and found that they have: improved  Disposition:  After consideration of the diagnostic results and the patients response to treatment, I feel that the patent would benefit from ***.    {Document critical care time when appropriate:1} {Document review of labs and clinical decision tools ie heart score, Chads2Vasc2 etc:1}  {Document your independent review of radiology images, and any outside records:1} {Document your discussion with family members, caretakers, and with consultants:1} {Document social determinants of health affecting pt's care:1} {Document your decision making why or why not admission, treatments were needed:1} Final Clinical Impression(s) / ED Diagnoses Final diagnoses:  None    Rx / DC Orders ED Discharge Orders     None

## 2022-01-16 NOTE — Discharge Instructions (Addendum)
You were evaluated in the Emergency Department and after careful evaluation, we did not find any emergent condition requiring admission or further testing in the hospital.  Your exam/testing today is overall reassuring.  Recommend rest and plenty of fluids over the next few days.  Please return to the Emergency Department if you experience any worsening of your condition.   Thank you for allowing [place] to be a part of your care.

## 2022-01-16 NOTE — ED Provider Notes (Signed)
  Provider Note MRN:  [id number]  Arrival date & time: 01/17/22    ED Course and Medical Decision Making  Assumed care from Dr. Rodena Medin at shift change.  Loss of consciousness episode while at work in the [employer], received CPR but EMS found him to be breathing on his own, strong pulse.  Work-up is reassuring, exam is reassuring, awaiting troponin, reevaluation.  Patient is very well-appearing on reassessment, normal vital signs, normal and symmetric strength and sensation, normal coordination, normal speech.  Not having any chest pain or shortness of breath.  All very reassuring and with a reassuring work-up he is appropriate for discharge.  Procedures  Final Clinical Impressions(s) / ED Diagnoses     ICD-10-CM   1. Syncope, unspecified syncope type  R55       ED Discharge Orders     None         Discharge Instructions      You were evaluated in the Emergency Department and after careful evaluation, we did not find any emergent condition requiring admission or further testing in the hospital.  Your exam/testing today is overall reassuring.  Recommend rest and plenty of fluids over the next few days.  Please return to the Emergency Department if you experience any worsening of your condition.   Thank you for allowing [place] to be a part of your care.      Elmer Sow. Pilar Plate, MD [facility] Emergency Medicine [facility] [email or web address]    Sabas Sous, MD 01/17/22 [phone]

## 2022-01-16 NOTE — ED Triage Notes (Signed)
Pt bib by gems from his job at [employer]. Pt was found on the floor at work by coworkers. Coworker Reports pt not breathing and cpr was started. When fire rescue arrived, pt was breathing and coworkers stopped cpr.

## 2022-01-17 LAB — RAPID URINE DRUG SCREEN, HOSP PERFORMED
Amphetamines: NOT DETECTED
Barbiturates: NOT DETECTED
Benzodiazepines: NOT DETECTED
Cocaine: NOT DETECTED
Opiates: NOT DETECTED
Tetrahydrocannabinol: NOT DETECTED

## 2022-01-17 NOTE — ED Notes (Signed)
Pt ambulated to restroom with steady gait with stand by assist.

## 2022-02-08 ENCOUNTER — Encounter (HOSPITAL_COMMUNITY): Payer: Self-pay | Admitting: Emergency Medicine

## 2022-02-08 ENCOUNTER — Emergency Department (HOSPITAL_COMMUNITY)
Admission: EM | Admit: 2022-02-08 | Discharge: 2022-02-08 | Disposition: A | Discharge status: Home / Self Care | Payer: Self-pay | Attending: Emergency Medicine | Admitting: Emergency Medicine

## 2022-02-08 ENCOUNTER — Emergency Department (HOSPITAL_COMMUNITY): Payer: Self-pay

## 2022-02-08 DIAGNOSIS — G44209 Tension-type headache, unspecified, not intractable: Secondary | ICD-10-CM | POA: Insufficient documentation

## 2022-02-08 DIAGNOSIS — R002 Palpitations: Secondary | ICD-10-CM | POA: Insufficient documentation

## 2022-02-08 HISTORY — DX: Thyrotoxicosis, unspecified without thyrotoxic crisis or storm: E05.90

## 2022-02-08 LAB — CBC
HCT: 42.8 % (ref 39.0–52.0)
Hemoglobin: 15.1 g/dL (ref 13.0–17.0)
MCH: 30.8 pg (ref 26.0–34.0)
MCHC: 35.3 g/dL (ref 30.0–36.0)
MCV: 87.2 fL (ref 80.0–100.0)
Platelets: 212 10*3/uL (ref 150–400)
RBC: 4.91 MIL/uL (ref 4.22–5.81)
RDW: 11.9 % (ref 11.5–15.5)
WBC: 5.4 10*3/uL (ref 4.0–10.5)
nRBC: 0 % (ref 0.0–0.2)

## 2022-02-08 LAB — BASIC METABOLIC PANEL
Anion gap: 5 (ref 5–15)
BUN: 10 mg/dL (ref 6–20)
CO2: 26 mmol/L (ref 22–32)
Calcium: 9 mg/dL (ref 8.9–10.3)
Chloride: 107 mmol/L (ref 98–111)
Creatinine, Ser: 0.88 mg/dL (ref 0.61–1.24)
GFR, Estimated: >60 mL/min (ref >60)
Glucose, Bld: 88 mg/dL (ref 70–99)
Potassium: 4.1 mmol/L (ref 3.5–5.1)
Sodium: 138 mmol/L (ref 135–145)

## 2022-02-08 LAB — T4, FREE: Free T4: 0.81 ng/dL (ref 0.61–1.12)

## 2022-02-08 LAB — TROPONIN I (HIGH SENSITIVITY)
Troponin I (High Sensitivity): <2 ng/L (ref <18)
Troponin I (High Sensitivity): <2 ng/L (ref <18)

## 2022-02-08 LAB — TSH: TSH: 3.995 u[IU]/mL (ref 0.350–4.500)

## 2022-02-08 MED ORDER — LACTATED RINGERS IV BOLUS
1000.0000 mL | Freq: Once | INTRAVENOUS | Status: AC
  Administered 2022-02-08: 1000 mL via INTRAVENOUS

## 2022-02-08 MED ORDER — KETOROLAC TROMETHAMINE 15 MG/ML IJ SOLN
15.0000 mg | Freq: Once | INTRAMUSCULAR | Status: AC
  Administered 2022-02-08: 15 mg via INTRAVENOUS
  Filled 2022-02-08: qty 1

## 2022-02-08 MED ORDER — METOCLOPRAMIDE HCL 5 MG/ML IJ SOLN
10.0000 mg | Freq: Once | INTRAMUSCULAR | Status: AC
  Administered 2022-02-08: 10 mg via INTRAVENOUS
  Filled 2022-02-08: qty 2

## 2022-02-08 NOTE — ED Provider Notes (Signed)
[facility]-EMERGENCY DEPT Provider Note   CSN: [id number] Arrival date & time: 02/08/22  1401     History {Add pertinent medical, surgical, social history, OB history to HPI:1} Chief Complaint  Patient presents with   Headache    Joseph Mclaughlin is a 26 y.o. male.  26 yo M with hx of hyperthyroidism on methimazole who presents with palpitations and headache. Has presented several times for chest pain and palpitations. ~1 month ago presented to this ED with syncopal episode where co-workers performed CPR but he was found to be breathing and with a good pulse by EMS so it was thought that he passed out rather than had an arrest. He was worked up in our ED with a reassuring evaluation and was dc'd home. Says that yesterday he started having palpitations again that have been off and on since. Has been compliant with his methimazole. Says that he occasionally gets chest pain that is temporary in the center of his chest, non-radiating, wo diaphoresis and is not exertional. No pleuritic and no SOB or cough. NO recent travel, surgeries or hx of malignancy. Says he does drink energy drinks and coffees and has been drinking less water than usual. No recreational drug use.   Also with HA that started today. Frontal and throbbing. + photophobia but no phonophobia. No vision changes focal weakness or numbness. Says that he was worked up for this last time as well with a head CT that was normal.    Headache Associated symptoms: no fever, no nausea, no neck pain, no neck stiffness and no vomiting        Home Medications Prior to Admission medications   Medication Sig Start Date End Date Taking? Authorizing Provider  methimazole (TAPAZOLE) 10 MG tablet Take 10 mg by mouth daily. 11/03/21   [provider]  loratadine (CLARITIN REDITABS) 10 MG dissolvable tablet Take 1 tablet (10 mg total) by mouth daily. Take one tablet as needed for allergy symptoms Patient not taking:  Reported on 06/07/2019 05/05/15 05/26/20  Tilman Neat, MD      Allergies    Patient has no known allergies.    Review of Systems   Review of Systems  Constitutional:  Negative for chills and fever.  Gastrointestinal:  Negative for nausea and vomiting.  Musculoskeletal:  Negative for neck pain and neck stiffness.  Neurological:  Positive for headaches.    Physical Exam Updated Vital Signs BP (!) 131/92   Pulse 84   Temp 98 F (36.7 C) (Oral)   Resp 15   Ht 5\' 5"  (1.651 m)   Wt 72 kg   SpO2 99%   BMI 26.41 kg/m  Physical Exam Vitals and nursing note reviewed.  Constitutional:      General: He is not in acute distress.    Appearance: He is well-developed.  HENT:     Head: Normocephalic and atraumatic.     Right Ear: External ear normal.     Left Ear: External ear normal.     Nose: Nose normal.  Eyes:     Extraocular Movements: Extraocular movements intact.     Conjunctiva/sclera: Conjunctivae normal.     Pupils: Pupils are equal, round, and reactive to light.  Cardiovascular:     Rate and Rhythm: Normal rate and regular rhythm.     Heart sounds: Normal heart sounds.  Pulmonary:     Effort: Pulmonary effort is normal. No respiratory distress.     Breath sounds: Normal breath  sounds.  Abdominal:     General: There is no distension.     Palpations: Abdomen is soft. There is no mass.     Tenderness: There is no abdominal tenderness. There is no guarding.  Musculoskeletal:        General: No swelling.     Cervical back: Normal range of motion and neck supple.     Right lower leg: No edema.     Left lower leg: No edema.  Skin:    General: Skin is warm and dry.     Capillary Refill: Capillary refill takes less than 2 seconds.  Neurological:     Mental Status: He is alert.     Comments: MENTAL STATUS: AAOx3 CRANIAL NERVES: II: Pupils equal and reactive 6 mm BL, no RAPD, no VF deficits III, IV, VI: EOM intact, no gaze preference or deviation, no nystagmus. V:  normal sensation to light touch in V1, V2, and V3 segments bilaterally VII: no facial weakness or asymmetry, no nasolabial fold flattening VIII: normal hearing to speech and finger friction IX, X: normal palatal elevation, no uvular deviation XI: 5/5 head turn and 5/5 shoulder shrug bilaterally XII: midline tongue protrusion MOTOR: 5/5 strength in R shoulder flexion, elbow flexion and extension, and grip strength. 5/5 strength in L shoulder flexion, elbow flexion and extension, and grip strength.  5/5 strength in R hip and knee flexion, knee extension, ankle plantar and dorsiflexion. 5/5 strength in L hip and knee flexion, knee extension, ankle plantar and dorsiflexion. SENSORY: Normal sensation to light touch in all extremities COORD: Normal finger to nose and heel to shin, no tremor, no dysmetria STATION: normal stance, no truncal ataxia GAIT: Normal   Psychiatric:        Mood and Affect: Mood normal.        Behavior: Behavior normal.     ED Results / Procedures / Treatments   Labs (all labs ordered are listed, but only abnormal results are displayed) Labs Reviewed  BASIC METABOLIC PANEL  CBC  TSH  T4, FREE  TROPONIN I (HIGH SENSITIVITY)  TROPONIN I (HIGH SENSITIVITY)    EKG EKG Interpretation  Date/Time:  Monday February 08 2022 14:16:48 EDT Ventricular Rate:  78 PR Interval:  171 QRS Duration: 96 QT Interval:  336 QTC Calculation: 383 R Axis:   106 Text Interpretation: Sinus rhythm Borderline right axis deviation Borderline ST elevation, anterior leads - WNL for patient's age Confirmed by Vonita Moss ([id number]) on 02/08/2022 4:41:18 PM  Radiology No results found.  Procedures Procedures  {Document cardiac monitor, telemetry assessment procedure when appropriate:1}  Medications Ordered in ED Medications - No data to display  ED Course/ Medical Decision Making/ A&P                           Medical Decision Making Amount and/or Complexity of Data  Reviewed Labs: ordered.   ***  {Document critical care time when appropriate:1} {Document review of labs and clinical decision tools ie heart score, Chads2Vasc2 etc:1}  {Document your independent review of radiology images, and any outside records:1} {Document your discussion with family members, caretakers, and with consultants:1} {Document social determinants of health affecting pt's care:1} {Document your decision making why or why not admission, treatments were needed:1} Final Clinical Impression(s) / ED Diagnoses Final diagnoses:  None    Rx / DC Orders ED Discharge Orders     None

## 2022-02-08 NOTE — ED Provider Triage Note (Cosign Needed Addendum)
Emergency Medicine Provider Triage Evaluation Note  Joseph Mclaughlin , a 26 y.o. male  was evaluated in triage.  Pt complains of generalized headache x2 days.  Denies injury, light sensitivity, vomiting, confusion.  Also reports persistent sensation of elevated heart rate.  Patient has been seen for this in the past.  Denies chest pain to me at this time.  He has tried ibuprofen without improvement.  Review of Systems  Positive: Headache, palpitations Negative: Chest pain  Physical Exam  BP (!) 131/92   Pulse 84   Temp 98 F (36.7 C) (Oral)   Resp 15   Ht 5\' 5"  (1.651 m)   Wt 72 kg   SpO2 99%   BMI 26.41 kg/m  Gen:   Awake, poor eye contact Resp:  Normal effort  MSK:   Moves extremities without difficulty  Other:  Heart no murmur, normal rate  Medical Decision Making  Medically screening exam initiated at 2:31 PM.  Appropriate orders placed.  Diyari Dravin Lance was informed that the remainder of the evaluation will be completed by another provider, this initial triage assessment does not replace that evaluation, and the importance of remaining in the ED until their evaluation is complete.     Nancy Fetter, PA-C 02/08/22 1443    04/10/22, PA-C 02/08/22 1443

## 2022-02-08 NOTE — Discharge Instructions (Addendum)
Hoy lo vieron en el departamento de emergencias por sus palpitaciones y Engineer, mining de [place].  En urgencias te hicieron un examen neurolgico que fue normal. Su electrocardiograma, troponinas y otros anlisis de sangre tambin fueron tranquilizadores. Tus estudios de tiroides an Metallurgist.  En casa, evite las bebidas energticas y las bebidas con cafena, ya que pueden empeorar sus palpitaciones.  Consulte su MyChart en lnea para ver los resultados de cualquier prueba que no haya dado cuando sali del departamento de Sports administrator.  Seguimiento con su mdico de Information systems manager en 2-3 das con respecto a su visita. Seguimiento con cardiologa acerca de sus palpitaciones.  Regrese de inmediato al departamento de emergencias si experimenta alguno de los siguientes: empeoramiento del dolor de cabeza, Engineer, mining en el pecho, [place] o cualquier otro sntoma preocupante.  Gracias por visitar nuestro Departamento de [address]. Fue un placer atenderte hoy.  ----------------  Today you were seen in the emergency department for your palpitations and headache.    In the emergency department you had a neurologic exam that was normal. Your EKG, troponins, and other blood work was also reassuring. Your thyroid studies are still pending.    At home, please avoid energy drinks and beverages with caffeine as these can cause your palpitations to get worse.   Check your MyChart online for the results of any tests that had not resulted by the time you left the emergency department.   Follow-up with your primary doctor in 2-3 days regarding your visit.  Follow-up with cardiology about your palpitations.   Return immediately to the emergency department if you experience any of the following: worsening headache, chest pain, fainting, or any other concerning symptoms.    Thank you for visiting our Emergency Department. It was a pleasure taking care of you today.

## 2022-02-08 NOTE — ED Triage Notes (Signed)
Pt covering his eyes and saying "its my brain again." Endorses headache for a few weeks. Also reports his heart is hurting which happens sometimes.

## 2022-02-09 ENCOUNTER — Encounter (HOSPITAL_COMMUNITY): Payer: Self-pay | Admitting: Emergency Medicine

## 2023-08-31 IMAGING — CR DG CHEST 2V
2 series · 2 of 2 positions shown · non-contrast
Comparison: Chest radiograph dated February 15, 2020

CLINICAL DATA: Chest pain

EXAM:
CHEST - 2 VIEW

[w chest pa]
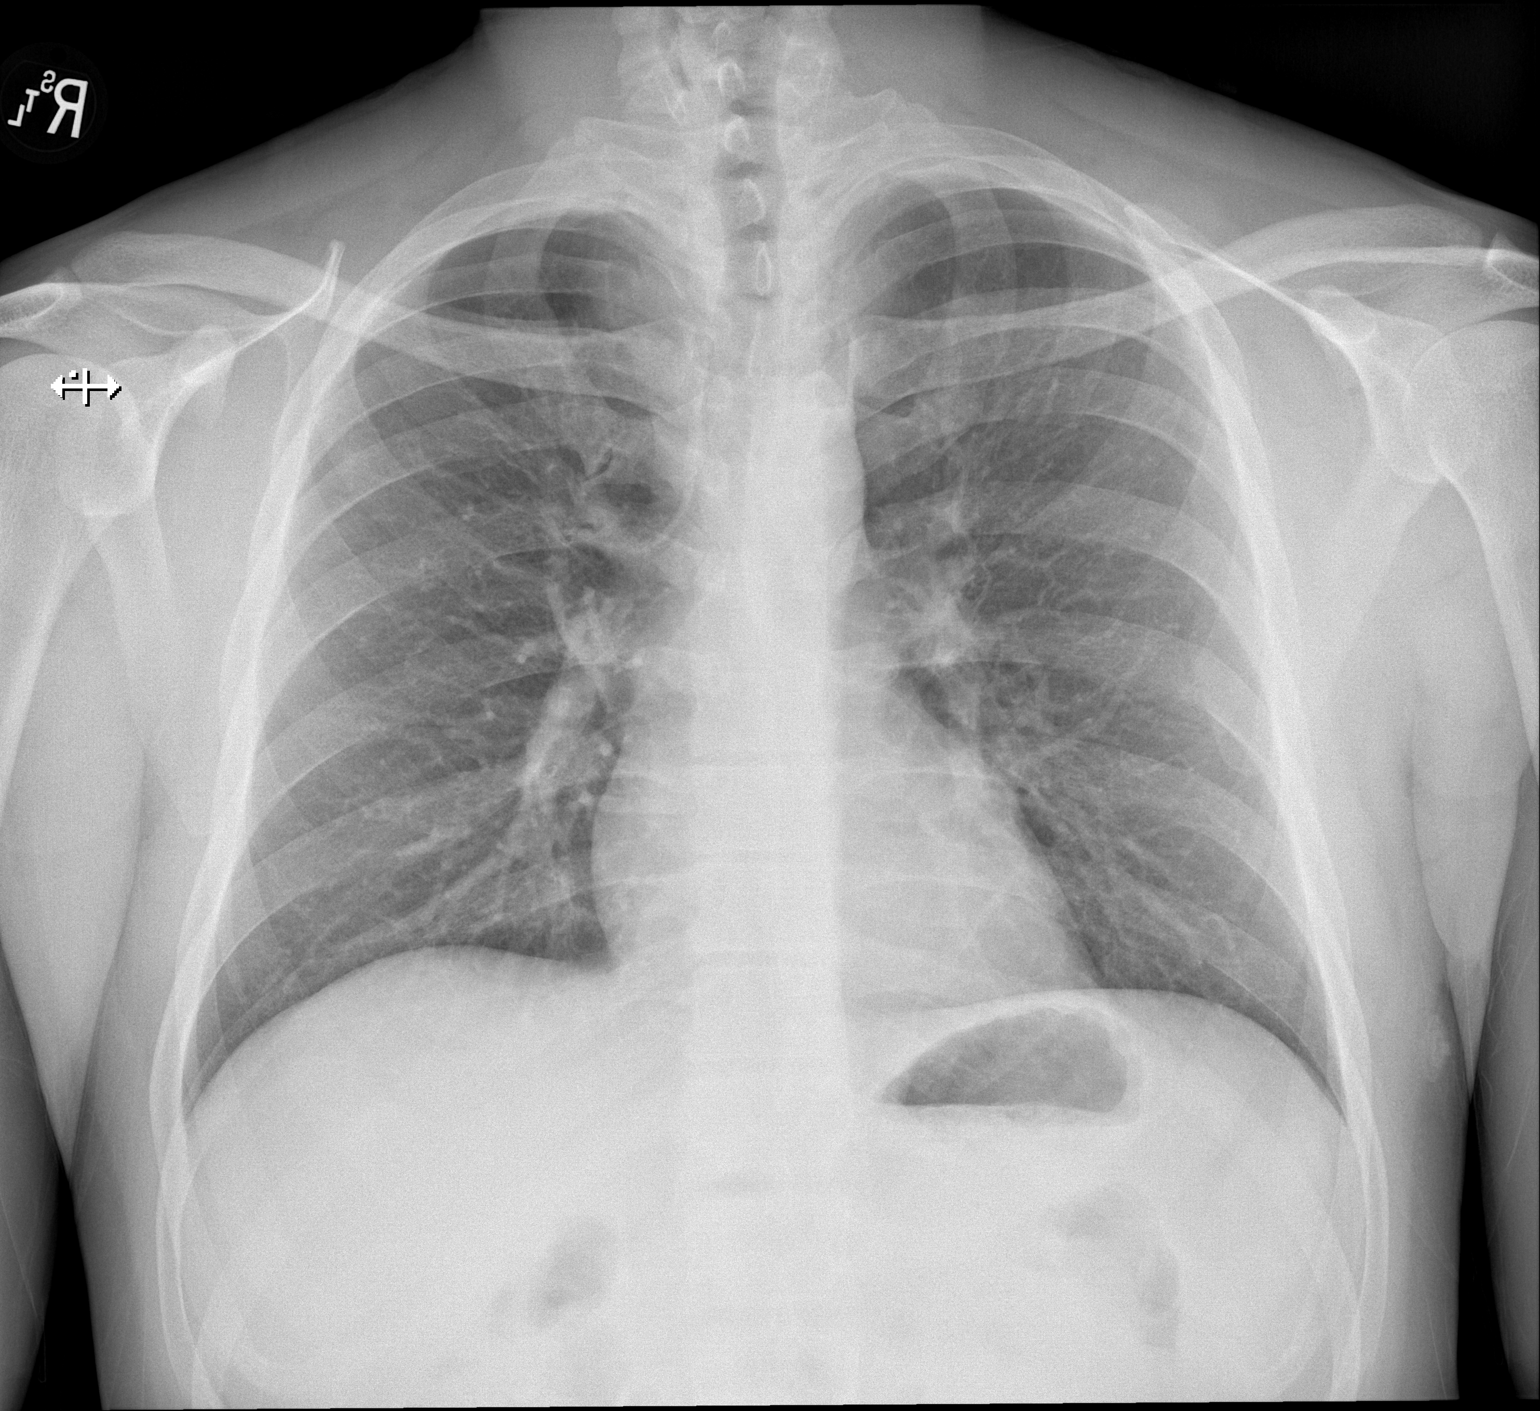

[w chest lat]
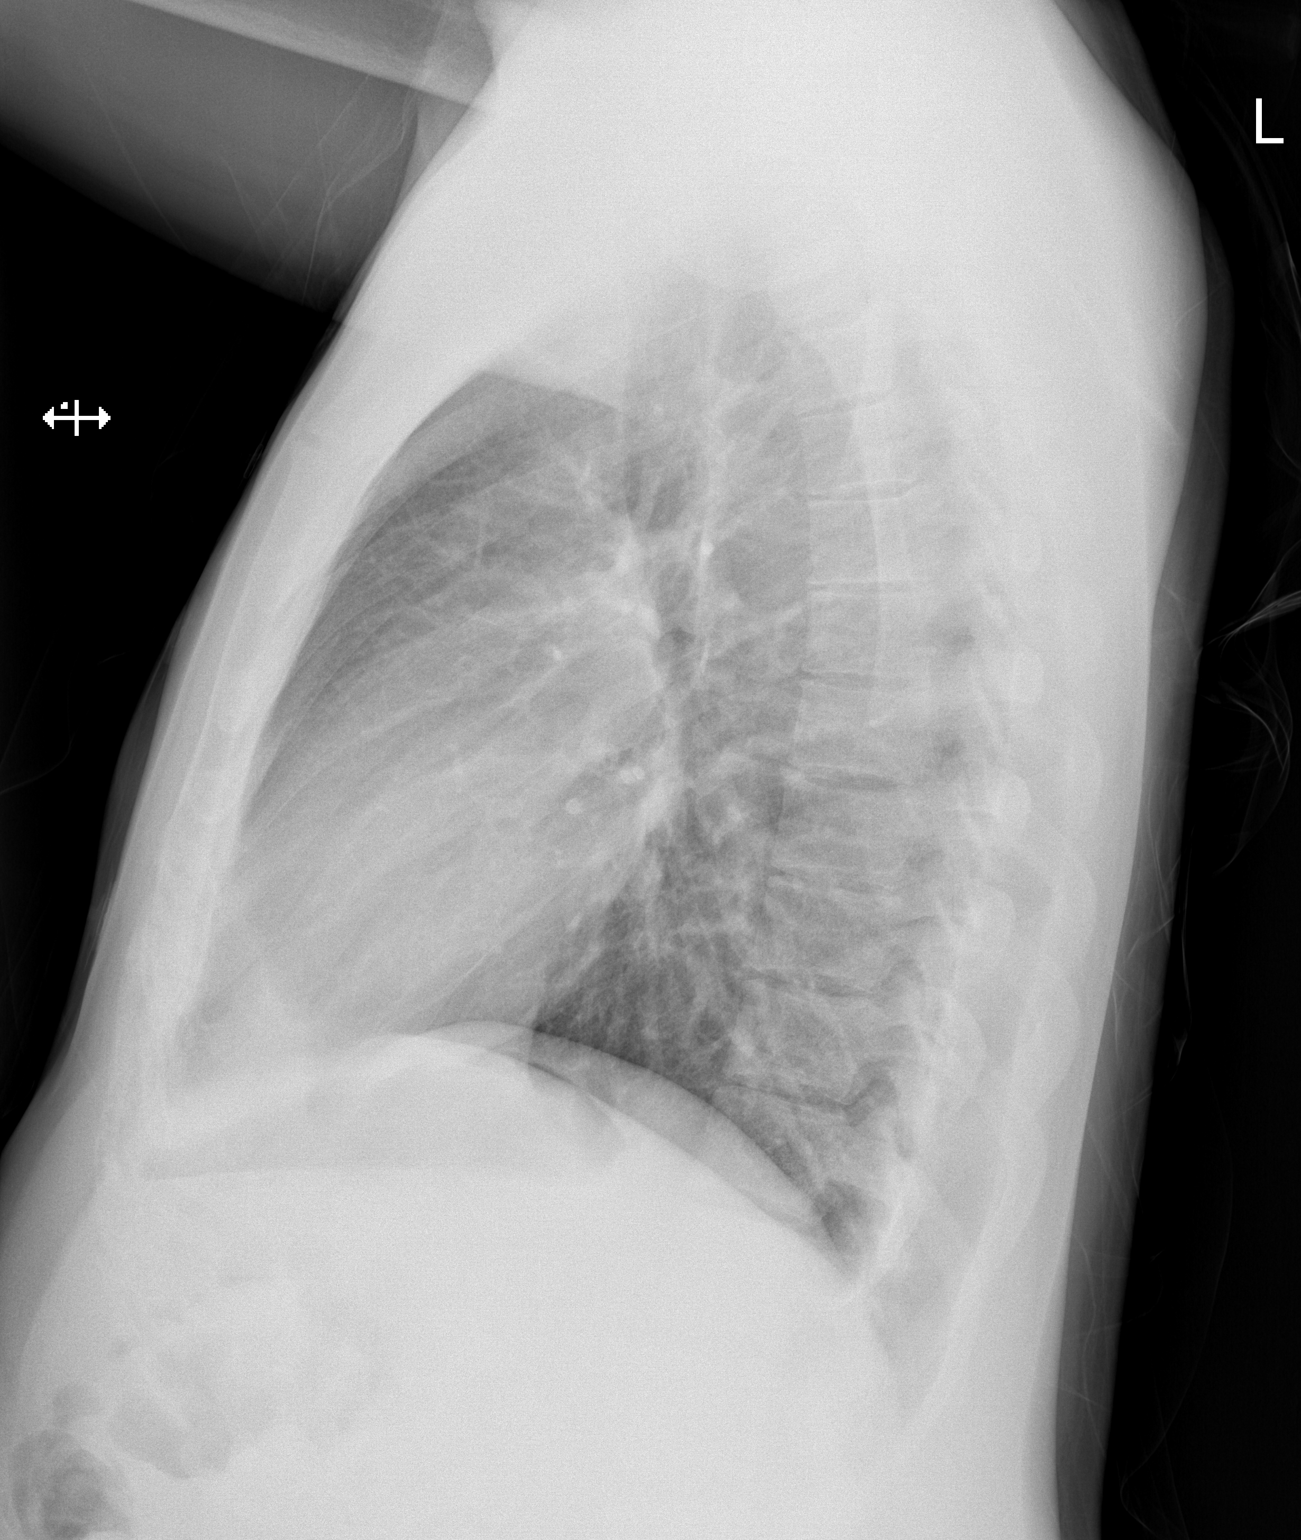

[2 of 2 positions shown; findings below may reference images not displayed]

FINDINGS: The heart size and mediastinal contours are within normal limits.
Both lungs are clear. The visualized skeletal structures are
unremarkable.
IMPRESSION: No active cardiopulmonary disease.

## 2023-10-09 ENCOUNTER — Emergency Department (HOSPITAL_COMMUNITY)
Admission: EM | Admit: 2023-10-09 | Discharge: 2023-10-09 | Disposition: A | Discharge status: Home / Self Care | Payer: Self-pay | Attending: Emergency Medicine | Admitting: Emergency Medicine

## 2023-10-09 ENCOUNTER — Emergency Department (HOSPITAL_COMMUNITY): Payer: Self-pay

## 2023-10-09 ENCOUNTER — Encounter (HOSPITAL_COMMUNITY): Payer: Self-pay | Admitting: Emergency Medicine

## 2023-10-09 ENCOUNTER — Other Ambulatory Visit: Payer: Self-pay

## 2023-10-09 DIAGNOSIS — R0602 Shortness of breath: Secondary | ICD-10-CM | POA: Insufficient documentation

## 2023-10-09 DIAGNOSIS — R55 Syncope and collapse: Secondary | ICD-10-CM | POA: Insufficient documentation

## 2023-10-09 DIAGNOSIS — R079 Chest pain, unspecified: Secondary | ICD-10-CM | POA: Insufficient documentation

## 2023-10-09 LAB — TSH: TSH: <0.01 u[IU]/mL — ABNORMAL LOW (ref 0.350–4.500)

## 2023-10-09 LAB — TROPONIN I (HIGH SENSITIVITY)
Troponin I (High Sensitivity): <2 ng/L (ref <18)
Troponin I (High Sensitivity): 2 ng/L (ref <18)

## 2023-10-09 LAB — BASIC METABOLIC PANEL WITH GFR
Anion gap: 9 (ref 5–15)
BUN: 7 mg/dL (ref 6–20)
CO2: 23 mmol/L (ref 22–32)
Calcium: 8.6 mg/dL — ABNORMAL LOW (ref 8.9–10.3)
Chloride: 106 mmol/L (ref 98–111)
Creatinine, Ser: 0.8 mg/dL (ref 0.61–1.24)
GFR, Estimated: >60 mL/min (ref >60)
Glucose, Bld: 109 mg/dL — ABNORMAL HIGH (ref 70–99)
Potassium: 3.7 mmol/L (ref 3.5–5.1)
Sodium: 138 mmol/L (ref 135–145)

## 2023-10-09 LAB — CBC
HCT: 43.3 % (ref 39.0–52.0)
Hemoglobin: 15.2 g/dL (ref 13.0–17.0)
MCH: 29.9 pg (ref 26.0–34.0)
MCHC: 35.1 g/dL (ref 30.0–36.0)
MCV: 85.2 fL (ref 80.0–100.0)
Platelets: 207 10*3/uL (ref 150–400)
RBC: 5.08 MIL/uL (ref 4.22–5.81)
RDW: 13 % (ref 11.5–15.5)
WBC: 7.3 10*3/uL (ref 4.0–10.5)
nRBC: 0 % (ref 0.0–0.2)

## 2023-10-09 LAB — CBG MONITORING, ED: Glucose-Capillary: 114 mg/dL — ABNORMAL HIGH (ref 70–99)

## 2023-10-09 NOTE — Discharge Instructions (Signed)
 It was a pleasure taking part in your care.  As we discussed, your work appears reassuring.  No signs of stress on your heart.  Please follow-up with PCP, call and make an appoint to be seen.  Return to the ED with any new or worsening symptoms.

## 2023-10-09 NOTE — ED Provider Notes (Signed)
 [name] EMERGENCY DEPARTMENT AT [facility] Provider Note   CSN: [id number] Arrival date & time: 10/09/23  4098     History  Chief Complaint  Patient presents with   Loss of Consciousness    Joseph Mclaughlin is a 28 y.o. male with medical history of hyperthyroidism.  The patient presents to the ED for evaluation of syncope.  States that he was on his way home with his girlfriend when he had a onset of chest pain associated with shortness of breath and a headache.  Reports that he then had a syncopal event where he "blacked out".  Reports that he is unsure how long he lost consciousness for.  States that his girlfriend lowered him to the ground during this event, he received CPR.  He denies striking his head and his syncopal event.  Denies any repeat chest pain or shortness of breath since initial episode around 930 prior to syncopal episode.  Reports history of syncopal episodes.  Reports compliance on hypothyroid meds.  Denies history of seizures.  Denies tongue biting, urine incontinence, postictal phase.  Denies drugs or alcohol.   Loss of Consciousness Associated symptoms: chest pain and shortness of breath        Home Medications Prior to Admission medications   Medication Sig Start Date End Date Taking? Authorizing Provider  methimazole (TAPAZOLE) 10 MG tablet Take 10 mg by mouth daily. 11/03/21   [provider]  loratadine (CLARITIN REDITABS) 10 MG dissolvable tablet Take 1 tablet (10 mg total) by mouth daily. Take one tablet as needed for allergy symptoms Patient not taking: Reported on 06/07/2019 05/05/15 05/26/20  Tilman Neat, MD      Allergies    Patient has no known allergies.    Review of Systems   Review of Systems  Respiratory:  Positive for shortness of breath.   Cardiovascular:  Positive for chest pain and syncope.  Neurological:  Positive for syncope.  All other systems reviewed and are negative.   Physical Exam Updated Vital  Signs BP 119/82   Pulse 87   Temp 98.2 F (36.8 C) (Oral)   Resp 15   Ht 5\' 5"  (1.651 m)   Wt 72 kg   SpO2 96%   BMI 26.41 kg/m  Physical Exam Vitals and nursing note reviewed.  Constitutional:      General: He is not in acute distress.    Appearance: He is well-developed.  HENT:     Head: Normocephalic and atraumatic.  Eyes:     Conjunctiva/sclera: Conjunctivae normal.  Cardiovascular:     Rate and Rhythm: Normal rate and regular rhythm.     Heart sounds: No murmur heard. Pulmonary:     Effort: Pulmonary effort is normal. No respiratory distress.     Breath sounds: Normal breath sounds.  Abdominal:     Palpations: Abdomen is soft.     Tenderness: There is no abdominal tenderness.  Musculoskeletal:        General: No swelling.     Cervical back: Neck supple.  Skin:    General: Skin is warm and dry.     Capillary Refill: Capillary refill takes less than 2 seconds.  Neurological:     General: No focal deficit present.     Mental Status: He is alert.     GCS: GCS eye subscore is 4. GCS verbal subscore is 5. GCS motor subscore is 6.     Cranial Nerves: Cranial nerves 2-12 are intact. No cranial  nerve deficit.     Sensory: Sensation is intact. No sensory deficit.     Motor: Motor function is intact. No weakness.     Comments: Intact finger-nose and heel-to-shin.  CN III through XII intact.  No pronator drift or slurred speech.  Equal grip strength upper extremities bilaterally.  Equal strength bilateral lower extremities.  Psychiatric:        Mood and Affect: Mood normal.     ED Results / Procedures / Treatments   Labs (all labs ordered are listed, but only abnormal results are displayed) Labs Reviewed  BASIC METABOLIC PANEL WITH GFR - Abnormal; Notable for the following components:      Result Value   Glucose, Bld 109 (*)    Calcium 8.6 (*)    All other components within normal limits  TSH - Abnormal; Notable for the following components:   TSH <0.010 (*)    All  other components within normal limits  CBG MONITORING, ED - Abnormal; Notable for the following components:   Glucose-Capillary 114 (*)    All other components within normal limits  CBC  TROPONIN I (HIGH SENSITIVITY)  TROPONIN I (HIGH SENSITIVITY)    EKG EKG Interpretation Date/Time:  Sunday October 09 2023 00:49:34 EDT Ventricular Rate:  81 PR Interval:  175 QRS Duration:  87 QT Interval:  329 QTC Calculation: 382 R Axis:   102  Text Interpretation: Sinus rhythm Probable left atrial enlargement Borderline right axis deviation Borderline ST elevation, anterolateral leads Similar to prior EKGs Confirmed by Vivi Barrack [id number]) on 10/09/2023 1:40:58 AM  Radiology DG Chest Portable 1 View Result Date: 10/09/2023 CLINICAL DATA:  Palpitations and syncope. EXAM: PORTABLE CHEST 1 VIEW COMPARISON:  February 08, 2022 FINDINGS: The heart size and mediastinal contours are within normal limits. Both lungs are clear. The visualized skeletal structures are unremarkable. IMPRESSION: No acute cardiopulmonary disease. Electronically Signed   By: Aram Candela M.D.   On: 10/09/2023 01:38    Procedures Procedures   Medications Ordered in ED Medications - No data to display  ED Course/ Medical Decision Making/ A&P  Medical Decision Making Amount and/or Complexity of Data Reviewed Labs: ordered. Radiology: ordered.   28 year old male presents for evaluation.  Please see HPI for further details.  On examination patient is afebrile and nontachycardic.  His lung sounds are clear bilaterally, he is nonhypoxic.  Abdomen is soft and compressible.  Neurological examinations at baseline without focal neurodeficits.  Will collect CBC, BMP, troponin, point-of-care CBG, TSH, chest x-ray and EKG.  CBC without leukocytosis or anemia.  Metabolic panel without electrolyte derangement.  Troponin is less than 2, delta 2.  Point-of-care CBG 114.  TSH low.  Chest x-ray unremarkable.  EKG nonischemic.  Patient  workup here reassuring and unremarkable.  Patient reports instances of syncope in the past, states that this instance tonight was very similar to past episodes.  Patient was advised to follow-up with his PCP and he voiced understanding.  He is in agreement with the plan.  He all of his questions answered to his satisfaction.  Stable to discharge.  Final Clinical Impression(s) / ED Diagnoses Final diagnoses:  Syncope and collapse    Rx / DC Orders ED Discharge Orders     None         Al Decant, PA-C 10/09/23 0449    Loetta Rough, MD 10/09/23 [phone]

## 2023-10-09 NOTE — ED Triage Notes (Signed)
 Patient from home BIB [facility] with reports of syncope. Patient was driving home when he reported to his girlfriend that he felt that his heart was racing and that he was having palpitations. Patient made it home from driving car and stepped out of car, had a syncopal episode, was caught by the girlfriend so no head/neck trauma. Family administered 2 rounds of CPR while patient was unconscious. VSS w/ EMS.  Patient is Aox4 upon arrival. Denies chest pain, SHOB.

## 2023-12-21 ENCOUNTER — Emergency Department (HOSPITAL_COMMUNITY)
Admission: EM | Admit: 2023-12-21 | Discharge: 2023-12-21 | Disposition: A | Discharge status: Home / Self Care | Payer: Self-pay | Attending: Emergency Medicine | Admitting: Emergency Medicine

## 2023-12-21 ENCOUNTER — Encounter (HOSPITAL_COMMUNITY): Payer: Self-pay

## 2023-12-21 ENCOUNTER — Other Ambulatory Visit: Payer: Self-pay

## 2023-12-21 DIAGNOSIS — K047 Periapical abscess without sinus: Secondary | ICD-10-CM | POA: Insufficient documentation

## 2023-12-21 DIAGNOSIS — R030 Elevated blood-pressure reading, without diagnosis of hypertension: Secondary | ICD-10-CM | POA: Insufficient documentation

## 2023-12-21 DIAGNOSIS — K029 Dental caries, unspecified: Secondary | ICD-10-CM | POA: Insufficient documentation

## 2023-12-21 LAB — I-STAT CG4 LACTIC ACID, ED: Lactic Acid, Venous: 0.9 mmol/L (ref 0.5–1.9)

## 2023-12-21 MED ORDER — IBUPROFEN 200 MG PO TABS
400.0000 mg | ORAL_TABLET | Freq: Once | ORAL | Status: AC
  Administered 2023-12-21: 400 mg via ORAL
  Filled 2023-12-21: qty 2

## 2023-12-21 MED ORDER — ACETAMINOPHEN 325 MG PO TABS
650.0000 mg | ORAL_TABLET | Freq: Once | ORAL | Status: AC
  Administered 2023-12-21: 650 mg via ORAL
  Filled 2023-12-21: qty 2

## 2023-12-21 MED ORDER — AMOXICILLIN 500 MG PO CAPS
500.0000 mg | ORAL_CAPSULE | Freq: Once | ORAL | Status: AC
Start: 2023-12-21 — End: 2023-12-21
  Administered 2023-12-21: 500 mg via ORAL
  Filled 2023-12-21: qty 1

## 2023-12-21 MED ORDER — AMOXICILLIN 500 MG PO CAPS: 500.0000 mg | ORAL_CAPSULE | Freq: Three times a day (TID) | ORAL | 0 refills | Status: AC

## 2023-12-21 MED ORDER — ONDANSETRON 4 MG PO TBDP
4.0000 mg | ORAL_TABLET | Freq: Once | ORAL | Status: AC
  Administered 2023-12-21: 4 mg via ORAL
  Filled 2023-12-21: qty 1

## 2023-12-21 MED ORDER — OXYCODONE-ACETAMINOPHEN 5-325 MG PO TABS
1.0000 | ORAL_TABLET | ORAL | Status: DC | PRN
  Administered 2023-12-21: 1 via ORAL
  Filled 2023-12-21: qty 1

## 2023-12-21 NOTE — Discharge Instructions (Addendum)
 It was our pleasure to provide your ER care today - we hope that you feel better.  Take amoxicillin  as prescribed. Take acetaminophen  or ibuprofen  as need for pain.    Follow up closely with dentist in the coming week. Your blood pressure is high today - follow up with primary care doctor in the next 1-2 weeks.   Return to ER if worse, new symptoms, fevers, intractable pain, severe facial swelling, trouble breathing or swallowing, or other emergency concern.

## 2023-12-21 NOTE — ED Provider Notes (Signed)
 [place] EMERGENCY DEPARTMENT AT [facility] Provider Note   CSN: [id number] Arrival date & time: 12/21/23  1929     Patient presents with: Dental Pain   Joseph Mclaughlin is a 28 y.o. male.   Pt with c/o left upper dental pain in the past few days. Indicates has hole/crack in tooth. No local dentist. Prior similar problems w same tooth, last saw dentist 2-3 yrs ago. No facial swelling or redness. No neck pain or swelling. No fever or chills. No sore throat, no trouble breathing or swallowing.  No recent antibiotics. No meds for pain pta.   The history is provided by the patient and medical records.  Dental Pain Associated symptoms: no fever, no headaches and no neck pain        Prior to Admission medications   Medication Sig Start Date End Date Taking? Authorizing Provider  methimazole (TAPAZOLE) 10 MG tablet Take 10 mg by mouth daily. 11/03/21   [provider]  loratadine  (CLARITIN  REDITABS) 10 MG dissolvable tablet Take 1 tablet (10 mg total) by mouth daily. Take one tablet as needed for allergy symptoms Patient not taking: Reported on 06/07/2019 05/05/15 05/26/20  Aniceto Kern, MD    Allergies: Patient has no known allergies.    Review of Systems  Constitutional:  Negative for fever.  HENT:  Positive for dental problem. Negative for sore throat and trouble swallowing.   Respiratory:  Negative for shortness of breath.   Gastrointestinal:  Negative for nausea and vomiting.  Musculoskeletal:  Negative for neck pain and neck stiffness.  Neurological:  Negative for headaches.    Updated Vital Signs BP (!) 145/97 (BP Location: Right Arm)   Pulse 71   Temp 98.1 F (36.7 C) (Oral)   Resp 18   Ht 1.651 m (5' 5)   Wt 69.9 kg   SpO2 98%   BMI 25.63 kg/m   Physical Exam Vitals and nursing note reviewed.  Constitutional:      Appearance: Normal appearance. He is well-developed.  HENT:     Head: Atraumatic.     Nose: Nose normal.      Mouth/Throat:     Mouth: Mucous membranes are moist.     Pharynx: Oropharynx is clear.     Comments: Left upper dental caries w mild gum swelling/tenderness, no fluctuance or drainable abscess. No trismus. Pharynx normal. No pain, swelling or tenderness to floor of mouth or neck.  Eyes:     General: No scleral icterus.    Conjunctiva/sclera: Conjunctivae normal.   Neck:     Trachea: No tracheal deviation.     Comments: No stiffness or rigidity.  Cardiovascular:     Rate and Rhythm: Normal rate and regular rhythm.     Pulses: Normal pulses.     Heart sounds: Normal heart sounds. No murmur heard.    No friction rub. No gallop.  Pulmonary:     Effort: Pulmonary effort is normal. No accessory muscle usage or respiratory distress.     Breath sounds: Normal breath sounds. No stridor.  Abdominal:     General: Bowel sounds are normal.   Musculoskeletal:        General: No swelling.     Cervical back: Normal range of motion and neck supple. No rigidity.  Lymphadenopathy:     Cervical: No cervical adenopathy.   Skin:    General: Skin is warm and dry.     Findings: No rash.   Neurological:  Mental Status: He is alert.     Comments: Alert, speech clear.   Psychiatric:        Mood and Affect: Mood normal.     (all labs ordered are listed, but only abnormal results are displayed) Labs Reviewed  I-STAT CG4 LACTIC ACID, ED    EKG: None  Radiology: No results found.   Procedures   Medications Ordered in the ED  oxyCODONE-acetaminophen  (PERCOCET/ROXICET) 5-325 MG per tablet 1 tablet (1 tablet Oral Given 12/21/23 2000)  acetaminophen  (TYLENOL ) tablet 650 mg (has no administration in time range)  ibuprofen  (ADVIL ) tablet 400 mg (has no administration in time range)  amoxicillin  (AMOXIL ) capsule 500 mg (has no administration in time range)  ondansetron (ZOFRAN-ODT) disintegrating tablet 4 mg (4 mg Oral Given 12/21/23 2000)                                    Medical  Decision Making Problems Addressed: Dental abscess: acute illness or injury Dental caries: acute illness or injury Elevated blood pressure reading: acute illness or injury  Amount and/or Complexity of Data Reviewed External Data Reviewed: notes.  Risk OTC drugs. Prescription drug management.   No meds pta. Confirmed nkda.   Amoxicillin  po. Ibuprofen  po, acetaminophen  po.   Reviewed nursing notes and prior charts for additional history.   Rec close dental f/u.  Return precautions provided.      Final diagnoses:  None    ED Discharge Orders     None          Guadalupe Lee, MD 12/21/23 2019

## 2023-12-21 NOTE — ED Triage Notes (Signed)
 Patient reports dental pain on the left side of the upper jaw for 2 days. States he has a crack in his tooth. Patient has been taking tylenol  for with no relief. No difficulties with speech or swallowing. Has not seen a dentist in 2-3 years.
